# Patient Record
Sex: Female | Born: 1960
Health system: Southern US, Community
[De-identification: ages and names within clinical notes are randomized; demographics above are authoritative.]

## PROBLEM LIST (undated history)

## (undated) DIAGNOSIS — I10 Essential (primary) hypertension: Secondary | ICD-10-CM

## (undated) DIAGNOSIS — IMO0002 Reserved for concepts with insufficient information to code with codable children: Secondary | ICD-10-CM

## (undated) DIAGNOSIS — E785 Hyperlipidemia, unspecified: Secondary | ICD-10-CM

## (undated) DIAGNOSIS — E119 Type 2 diabetes mellitus without complications: Secondary | ICD-10-CM

## (undated) HISTORY — PX: HAMMER TOE SURGERY: SHX385

## (undated) HISTORY — PX: COLONOSCOPY: SHX174

## (undated) HISTORY — PX: TUBAL LIGATION: SHX77

## (undated) HISTORY — DX: Reserved for concepts with insufficient information to code with codable children: IMO0002

## (undated) HISTORY — DX: Hyperlipidemia, unspecified: E78.5

## (undated) HISTORY — DX: Type 2 diabetes mellitus without complications: E11.9

## (undated) HISTORY — DX: Essential (primary) hypertension: I10

## (undated) HISTORY — PX: ECTOPIC PREGNANCY SURGERY: SHX613

## (undated) HISTORY — PX: BREAST BIOPSY: SHX20

## (undated) HISTORY — PX: BREAST SURGERY: SHX581

---

## 1998-10-08 ENCOUNTER — Other Ambulatory Visit: Admission: RE | Admit: 1998-10-08 | Discharge: 1998-10-08 | Payer: Self-pay | Admitting: Obstetrics & Gynecology

## 1999-10-18 ENCOUNTER — Other Ambulatory Visit: Admission: RE | Admit: 1999-10-18 | Discharge: 1999-10-18 | Payer: Self-pay | Admitting: Obstetrics & Gynecology

## 2000-10-24 ENCOUNTER — Other Ambulatory Visit: Admission: RE | Admit: 2000-10-24 | Discharge: 2000-10-24 | Payer: Self-pay | Admitting: Obstetrics and Gynecology

## 2001-08-03 ENCOUNTER — Ambulatory Visit (HOSPITAL_COMMUNITY): Admission: RE | Admit: 2001-08-03 | Discharge: 2001-08-03 | Payer: Self-pay | Admitting: Gastroenterology

## 2001-08-09 ENCOUNTER — Encounter: Payer: Self-pay | Admitting: Gastroenterology

## 2001-08-09 ENCOUNTER — Ambulatory Visit (HOSPITAL_COMMUNITY): Admission: RE | Admit: 2001-08-09 | Discharge: 2001-08-09 | Payer: Self-pay | Admitting: Gastroenterology

## 2001-11-29 ENCOUNTER — Other Ambulatory Visit: Admission: RE | Admit: 2001-11-29 | Discharge: 2001-11-29 | Payer: Self-pay | Admitting: Obstetrics and Gynecology

## 2001-12-05 ENCOUNTER — Ambulatory Visit (HOSPITAL_COMMUNITY): Admission: RE | Admit: 2001-12-05 | Discharge: 2001-12-05 | Payer: Self-pay | Admitting: Obstetrics and Gynecology

## 2001-12-05 ENCOUNTER — Encounter: Payer: Self-pay | Admitting: Obstetrics and Gynecology

## 2002-02-23 ENCOUNTER — Emergency Department (HOSPITAL_COMMUNITY): Admission: EM | Admit: 2002-02-23 | Discharge: 2002-02-24 | Payer: Self-pay | Admitting: Emergency Medicine

## 2002-12-04 ENCOUNTER — Other Ambulatory Visit: Admission: RE | Admit: 2002-12-04 | Discharge: 2002-12-04 | Payer: Self-pay | Admitting: Obstetrics and Gynecology

## 2003-01-06 ENCOUNTER — Ambulatory Visit (HOSPITAL_COMMUNITY): Admission: RE | Admit: 2003-01-06 | Discharge: 2003-01-06 | Payer: Self-pay | Admitting: Obstetrics and Gynecology

## 2003-01-06 ENCOUNTER — Encounter: Payer: Self-pay | Admitting: Obstetrics and Gynecology

## 2003-12-14 ENCOUNTER — Emergency Department (HOSPITAL_COMMUNITY): Admission: EM | Admit: 2003-12-14 | Discharge: 2003-12-14 | Payer: Self-pay | Admitting: Emergency Medicine

## 2003-12-25 ENCOUNTER — Other Ambulatory Visit: Admission: RE | Admit: 2003-12-25 | Discharge: 2003-12-25 | Payer: Self-pay | Admitting: Obstetrics and Gynecology

## 2004-01-06 ENCOUNTER — Ambulatory Visit (HOSPITAL_COMMUNITY): Admission: RE | Admit: 2004-01-06 | Discharge: 2004-01-06 | Payer: Self-pay | Admitting: Obstetrics and Gynecology

## 2005-01-10 ENCOUNTER — Other Ambulatory Visit: Admission: RE | Admit: 2005-01-10 | Discharge: 2005-01-10 | Payer: Self-pay | Admitting: Obstetrics and Gynecology

## 2005-01-26 ENCOUNTER — Ambulatory Visit (HOSPITAL_COMMUNITY): Admission: RE | Admit: 2005-01-26 | Discharge: 2005-01-26 | Payer: Self-pay | Admitting: Obstetrics and Gynecology

## 2005-02-01 ENCOUNTER — Encounter: Admission: RE | Admit: 2005-02-01 | Discharge: 2005-02-01 | Payer: Self-pay | Admitting: Obstetrics and Gynecology

## 2005-07-27 ENCOUNTER — Emergency Department (HOSPITAL_COMMUNITY): Admission: EM | Admit: 2005-07-27 | Discharge: 2005-07-27 | Payer: Self-pay | Admitting: Emergency Medicine

## 2005-09-02 ENCOUNTER — Encounter: Admission: RE | Admit: 2005-09-02 | Discharge: 2005-09-02 | Payer: Self-pay | Admitting: Obstetrics and Gynecology

## 2006-03-07 ENCOUNTER — Other Ambulatory Visit: Admission: RE | Admit: 2006-03-07 | Discharge: 2006-03-07 | Payer: Self-pay | Admitting: Obstetrics and Gynecology

## 2006-04-03 ENCOUNTER — Encounter: Admission: RE | Admit: 2006-04-03 | Discharge: 2006-04-03 | Payer: Self-pay | Admitting: Obstetrics and Gynecology

## 2006-05-04 ENCOUNTER — Encounter: Admission: RE | Admit: 2006-05-04 | Discharge: 2006-05-04 | Payer: Self-pay | Admitting: Obstetrics and Gynecology

## 2007-06-19 ENCOUNTER — Encounter: Admission: RE | Admit: 2007-06-19 | Discharge: 2007-06-19 | Payer: Self-pay | Admitting: Obstetrics and Gynecology

## 2008-06-20 ENCOUNTER — Ambulatory Visit (HOSPITAL_COMMUNITY): Admission: RE | Admit: 2008-06-20 | Discharge: 2008-06-20 | Payer: Self-pay | Admitting: Obstetrics and Gynecology

## 2008-07-08 ENCOUNTER — Encounter: Admission: RE | Admit: 2008-07-08 | Discharge: 2008-07-08 | Payer: Self-pay | Admitting: Obstetrics and Gynecology

## 2008-07-10 ENCOUNTER — Encounter: Admission: RE | Admit: 2008-07-10 | Discharge: 2008-07-10 | Payer: Self-pay | Admitting: Obstetrics and Gynecology

## 2008-07-10 ENCOUNTER — Encounter (INDEPENDENT_AMBULATORY_CARE_PROVIDER_SITE_OTHER): Payer: Self-pay | Admitting: Diagnostic Radiology

## 2008-11-21 DIAGNOSIS — R87612 Low grade squamous intraepithelial lesion on cytologic smear of cervix (LGSIL): Secondary | ICD-10-CM

## 2008-11-21 DIAGNOSIS — IMO0002 Reserved for concepts with insufficient information to code with codable children: Secondary | ICD-10-CM

## 2008-11-21 HISTORY — DX: Reserved for concepts with insufficient information to code with codable children: IMO0002

## 2008-11-21 HISTORY — PX: COLPOSCOPY: SHX161

## 2008-11-21 HISTORY — DX: Low grade squamous intraepithelial lesion on cytologic smear of cervix (LGSIL): R87.612

## 2009-07-14 ENCOUNTER — Encounter: Admission: RE | Admit: 2009-07-14 | Discharge: 2009-07-14 | Payer: Self-pay | Admitting: Obstetrics and Gynecology

## 2009-09-21 ENCOUNTER — Encounter: Admission: RE | Admit: 2009-09-21 | Discharge: 2009-09-21 | Payer: Self-pay | Admitting: Orthopedic Surgery

## 2010-07-15 ENCOUNTER — Encounter: Admission: RE | Admit: 2010-07-15 | Discharge: 2010-07-15 | Payer: Self-pay | Admitting: Obstetrics and Gynecology

## 2010-12-12 ENCOUNTER — Encounter: Payer: Self-pay | Admitting: Obstetrics and Gynecology

## 2011-04-08 NOTE — Procedures (Signed)
[place]. [facility]  Patient:    Amber Barber, Amber Barber Visit Number: [id number] MRN: [id number]          Service Type: END Location: ENDO Attending Physician:  Charna Elizabeth Dictated by:   Anselmo Rod, M.D. Proc. Date: 08/03/01 Admit Date:  08/03/2001   CC:         Geraldo Pitter, M.D.   Procedure Report  DATE OF BIRTH: 1961/11/04  PROCEDURE: Esophagogastroduodenoscopy with biopsies.  ENDOSCOPIST: Anselmo Rod, M.D.  INSTRUMENT USED: Olympus video panendoscope.  INDICATIONS FOR PROCEDURE: This is a 50 year old African-American female, with a history of epigastric pain and reflux, rule out peptic ulcer disease.  PREPROCEDURE PREPARATION: Informed consent was procured from the patient and the patient fasted for eight hours prior to the procedure.  PREPROCEDURE PHYSICAL EXAMINATION:  VITAL SIGNS: Stable.  NECK: Supple.  CHEST: Clear to auscultation.  CARDIAC: S1 and S2, regular.  ABDOMEN: Soft, with normal bowel sounds.  DESCRIPTION OF PROCEDURE: The patient was placed in the left lateral decubitus position and sedated with 50 mg of Demerol and 5 mg of Versed intravenously. Once the patient was adequately sedated and maintained on low-flow oxygen and continuous cardiac monitoring the Olympus video panendoscope was advanced through the mouthpiece, over the tongue, and into the esophagus under direct vision.  The entire esophagus appeared normal and without lesions.  The scope was then advanced into the stomach and except for mild antral gastritis no other abnormalities were noted.  The proximal small bowel appeared normal as well.  IMPRESSION: Mild antral gastritis, otherwise normal esophagogastroduodenoscopy.  RECOMMENDATIONS: Proceed with colonoscopy at this time. Dictated by:   Anselmo Rod, M.D. Attending Physician:  Charna Elizabeth DD:  08/03/01 TD:  08/04/01 Job: [id number]

## 2011-04-08 NOTE — Procedures (Signed)
[place]. [facility]  Patient:    Amber Barber, Amber Barber Visit Number: [id number] MRN: [id number]          Service Type: END Location: ENDO Attending Physician:  Charna Elizabeth Dictated by:   Anselmo Rod, M.D. Proc. Date: 08/03/01 Admit Date:  08/03/2001   CC:         Geraldo Pitter, M.D.   Procedure Report  DATE OF BIRTH:  [birth year]  PROCEDURE PERFORMED:  Colonoscopy up to the mid-right colon.  ENDOSCOPIST:  Anselmo Rod, M.D.  INSTRUMENT USED:  Olympus video colonoscope.  INDICATION FOR PROCEDURE:  Family history of colon cancer; the patients sisters had colon cancer diagnosed at 78.  Rule out colonic polyps, masses, hemorrhoids, etc.  PREPROCEDURE PREPARATION:  Informed consent was procured from the patient. The patient was fasted for eight hours prior to the procedure and prepped with a bottle of magnesium citrate and a gallon of NuLytely the night prior to the procedure.  PREPROCEDURE PHYSICAL:  VITAL SIGNS:  Patient had stable vital signs.  NECK:  Supple.  CHEST:  Clear to auscultation.  S1 and S2 regular.  ABDOMEN:  Soft with normal bowel sounds.  DESCRIPTION OF THE PROCEDURE:  The patient was placed in the left lateral decubitus position.  Patient was sedated with Demerol and Versed for the EGD. No additional sedation was used for the colonoscopy.  Once the patient was adequately positioned and maintained on low-flow oxygen and continuous cardiac monitoring, the Olympus video colonoscope was advanced from the rectum to the mid-right colon with extreme difficulty secondary to a large amount of residual stool in the colon.  Multiple washings were done.  The patients position was changed from the left lateral to the supine and then to the right lateral position to adequately visualize the colonic mucosa but this was not possible.  The procedure was aborted in the mid-right colon.  No abnormalities were noted, however, small  lesions could have been missed.  IMPRESSION:  Incomplete prep, procedure aborted at mid-right colon.  RECOMMENDATIONS:  An air contrast barium enema is planned for the patient. Further recommendations will be made thereafter. Dictated by:   Anselmo Rod, M.D. Attending Physician:  Charna Elizabeth DD:  08/03/01 TD:  08/04/01 Job: [id number]

## 2011-08-29 ENCOUNTER — Other Ambulatory Visit: Payer: Self-pay | Admitting: Obstetrics and Gynecology

## 2011-08-29 DIAGNOSIS — Z1231 Encounter for screening mammogram for malignant neoplasm of breast: Secondary | ICD-10-CM

## 2011-09-08 ENCOUNTER — Ambulatory Visit: Payer: Self-pay

## 2011-09-16 ENCOUNTER — Ambulatory Visit
Admission: RE | Admit: 2011-09-16 | Discharge: 2011-09-16 | Disposition: A | Discharge status: Home / Self Care | Payer: BC Managed Care – PPO | Source: Ambulatory Visit | Attending: Obstetrics and Gynecology | Admitting: Obstetrics and Gynecology

## 2011-09-16 DIAGNOSIS — Z1231 Encounter for screening mammogram for malignant neoplasm of breast: Secondary | ICD-10-CM

## 2011-09-21 ENCOUNTER — Other Ambulatory Visit: Payer: Self-pay | Admitting: Obstetrics and Gynecology

## 2011-09-21 DIAGNOSIS — R928 Other abnormal and inconclusive findings on diagnostic imaging of breast: Secondary | ICD-10-CM

## 2011-10-04 ENCOUNTER — Ambulatory Visit
Admission: RE | Admit: 2011-10-04 | Discharge: 2011-10-04 | Disposition: A | Discharge status: Home / Self Care | Payer: BC Managed Care – PPO | Source: Ambulatory Visit | Attending: Obstetrics and Gynecology | Admitting: Obstetrics and Gynecology

## 2011-10-04 ENCOUNTER — Other Ambulatory Visit: Payer: Self-pay | Admitting: Obstetrics and Gynecology

## 2011-10-04 DIAGNOSIS — R928 Other abnormal and inconclusive findings on diagnostic imaging of breast: Secondary | ICD-10-CM

## 2011-10-17 ENCOUNTER — Other Ambulatory Visit: Payer: Self-pay | Admitting: Obstetrics and Gynecology

## 2011-10-17 ENCOUNTER — Ambulatory Visit
Admission: RE | Admit: 2011-10-17 | Discharge: 2011-10-17 | Disposition: A | Discharge status: Home / Self Care | Payer: BC Managed Care – PPO | Source: Ambulatory Visit | Attending: Obstetrics and Gynecology | Admitting: Obstetrics and Gynecology

## 2011-10-17 DIAGNOSIS — R928 Other abnormal and inconclusive findings on diagnostic imaging of breast: Secondary | ICD-10-CM

## 2012-09-17 ENCOUNTER — Other Ambulatory Visit: Payer: Self-pay | Admitting: Obstetrics and Gynecology

## 2012-09-17 DIAGNOSIS — Z1231 Encounter for screening mammogram for malignant neoplasm of breast: Secondary | ICD-10-CM

## 2012-10-02 ENCOUNTER — Encounter: Payer: Self-pay | Admitting: Obstetrics and Gynecology

## 2012-10-02 ENCOUNTER — Ambulatory Visit (INDEPENDENT_AMBULATORY_CARE_PROVIDER_SITE_OTHER): Payer: BC Managed Care – PPO | Admitting: Obstetrics and Gynecology

## 2012-10-02 VITALS — BP 112/70 | HR 72 | Ht 69.0 in | Wt 185.0 lb

## 2012-10-02 DIAGNOSIS — Z124 Encounter for screening for malignant neoplasm of cervix: Secondary | ICD-10-CM

## 2012-10-02 DIAGNOSIS — Z8349 Family history of other endocrine, nutritional and metabolic diseases: Secondary | ICD-10-CM

## 2012-10-02 DIAGNOSIS — N951 Menopausal and female climacteric states: Secondary | ICD-10-CM

## 2012-10-02 DIAGNOSIS — N390 Urinary tract infection, site not specified: Secondary | ICD-10-CM

## 2012-10-02 DIAGNOSIS — Z01419 Encounter for gynecological examination (general) (routine) without abnormal findings: Secondary | ICD-10-CM

## 2012-10-02 DIAGNOSIS — N898 Other specified noninflammatory disorders of vagina: Secondary | ICD-10-CM

## 2012-10-02 LAB — POCT URINALYSIS DIPSTICK
Bilirubin, UA: NEGATIVE
Ketones, UA: NEGATIVE
Nitrite, UA: NEGATIVE
Protein, UA: NEGATIVE

## 2012-10-02 LAB — TSH: TSH: 1.487 u[IU]/mL (ref 0.350–4.500)

## 2012-10-02 MED ORDER — CIPROFLOXACIN HCL 500 MG PO TABS: 500.0000 mg | ORAL_TABLET | Freq: Two times a day (BID) | ORAL | Status: AC

## 2012-10-02 MED ORDER — PAROXETINE MESYLATE 7.5 MG PO CAPS: 1.0000 | ORAL_CAPSULE | Freq: Every day | ORAL | Status: DC

## 2012-10-02 MED ORDER — FLUCONAZOLE 150 MG PO TABS: 150.0000 mg | ORAL_TABLET | Freq: Once | ORAL | Status: DC

## 2012-10-02 NOTE — Progress Notes (Signed)
Regular Periods: no Mammogram: yes  Monthly Breast Ex.: yes Exercise: yes  Tetanus < 10 years: no Seatbelts: yes  NI. Bladder Functn.: yes Abuse at home: no  Daily BM's: yes Stressful Work: no  Healthy Diet: yes Sigmoid-Colonoscopy: about 10 years ago  Calcium: no Medical problems this year: check for yeast and uti   LAST PAP:7/12  Contraception: none  Mammogram:  2012  PCP: DR. Parke Simmers  PMH: NO CHANGE  FMH: NO CHANGE  Last Bone Scan: NO  PT IS MARRIED

## 2012-10-02 NOTE — Progress Notes (Signed)
Subjective:    Amber Barber is a 51 y.o. female, G3P0, who presents for an annual exam. The patient reports yeast vs uti symptoms and would like evaluation.  Menstrual cycle:   LMP: Patient's last menstrual period was 12/03/2011.             Review of Systems Pertinent items are noted in HPI. Denies pelvic pain, urinary tract symptoms, vaginitis symptoms, irregular bleeding, menopausal symptoms, change in bowel habits or rectal bleeding   Objective:    Ht 5\' 9"  (1.753 m)  Wt 185 lb (83.915 kg)  BMI 27.32 kg/m2  LMP 12/03/2011    Wt Readings from Last 1 Encounters:  10/02/12 185 lb (83.915 kg)   Body mass index is 27.32 kg/(m^2). General Appearance: Alert, no acute distress HEENT: Grossly normal Neck / Thyroid: Supple, no thyromegaly or cervical adenopathy Lungs: Clear to auscultation bilaterally Back: No CVA tenderness Breast Exam: No masses or nodes.No dimpling, nipple retraction or discharge. Cardiovascular: Regular rate and rhythm.  Gastrointestinal: Soft, non-tender, no masses or organomegaly Pelvic Exam: EGBUS-wnl, vagina-normal rugae, cervix- without lesions or tenderness, uterus appears normal size shape and consistency, adnexae-no masses or tenderness Rectovaginal: no masses and normal sphincter tone Lymphatic Exam: Non-palpable nodes in neck, clavicular,  axillary, or inguinal regions  Skin: no rashes or abnormalities Extremities: no clubbing cyanosis or edema  Neurologic: grossly normal Psychiatric: Alert and oriented  Wet Prep: pH-4.5, whiff-negative,  no clue cells, yeast or trich Urinalysis: SG-1.020,  pH-6,  leuk-4+   Assessment:   Routine GYN Exam UTI  Vasomotor symptoms Family History-Thyroid Disease Plan:  urine for culture Cipro 500 mg # 6 1 po bid x 3 days  Diflucan 150 mg  #1  1 po stat 1 refill prn  Reviewed management options for menopausal symptoms: herbal, hormonal and misc. Gave brochure on Estrovera,  ACOG-Herbal Remedies for Menopause     Patient considering Leanora Ivanoff and Brisdelle (Paroxetine 7.5 mg) for vasomotor symptoms Brisdelle sample x 2 given  1 po qd  PAP sent  RTO 1 year or prn  Liba Hulsey,ELMIRAPA-C

## 2012-10-03 LAB — PAP IG W/ RFLX HPV ASCU

## 2012-10-06 LAB — URINE CULTURE

## 2012-10-24 ENCOUNTER — Ambulatory Visit
Admission: RE | Admit: 2012-10-24 | Discharge: 2012-10-24 | Disposition: A | Discharge status: Home / Self Care | Payer: BC Managed Care – PPO | Source: Ambulatory Visit | Attending: Obstetrics and Gynecology | Admitting: Obstetrics and Gynecology

## 2012-10-24 DIAGNOSIS — Z1231 Encounter for screening mammogram for malignant neoplasm of breast: Secondary | ICD-10-CM

## 2012-10-29 ENCOUNTER — Encounter: Payer: Self-pay | Admitting: Obstetrics and Gynecology

## 2013-01-08 ENCOUNTER — Telehealth: Payer: Self-pay | Admitting: Obstetrics and Gynecology

## 2013-01-08 MED ORDER — FLUCONAZOLE 150 MG PO TABS
150.0000 mg | ORAL_TABLET | Freq: Once | ORAL | Status: DC
Start: 2013-01-08 — End: 2013-09-09

## 2013-01-08 NOTE — Telephone Encounter (Signed)
Pt only wants to speak with Amber Barber

## 2013-01-08 NOTE — Telephone Encounter (Signed)
Tc to pt regarding message. Pt is having discharge and would like rx for vaginal discharge. Informed pt that I can call in Diflucan and if this do not take care of the problem;pt will need an appt. Pt voiced understanding.

## 2013-01-08 NOTE — Telephone Encounter (Signed)
Lm on vm tcb rgd msg 

## 2013-02-13 ENCOUNTER — Other Ambulatory Visit: Payer: Self-pay | Admitting: Family Medicine

## 2013-02-13 ENCOUNTER — Ambulatory Visit
Admission: RE | Admit: 2013-02-13 | Discharge: 2013-02-13 | Disposition: A | Discharge status: Home / Self Care | Payer: BC Managed Care – PPO | Source: Ambulatory Visit | Attending: Family Medicine | Admitting: Family Medicine

## 2013-02-13 DIAGNOSIS — M25519 Pain in unspecified shoulder: Secondary | ICD-10-CM

## 2013-06-28 ENCOUNTER — Other Ambulatory Visit: Payer: Self-pay | Admitting: Orthopedic Surgery

## 2013-06-28 DIAGNOSIS — M25511 Pain in right shoulder: Secondary | ICD-10-CM

## 2013-07-04 ENCOUNTER — Ambulatory Visit
Admission: RE | Admit: 2013-07-04 | Discharge: 2013-07-04 | Disposition: A | Discharge status: Home / Self Care | Payer: BC Managed Care – PPO | Source: Ambulatory Visit | Attending: Orthopedic Surgery | Admitting: Orthopedic Surgery

## 2013-07-04 DIAGNOSIS — M25511 Pain in right shoulder: Secondary | ICD-10-CM

## 2013-07-06 ENCOUNTER — Encounter (HOSPITAL_COMMUNITY): Payer: Self-pay | Admitting: Emergency Medicine

## 2013-07-06 ENCOUNTER — Emergency Department (INDEPENDENT_AMBULATORY_CARE_PROVIDER_SITE_OTHER)
Admission: EM | Admit: 2013-07-06 | Discharge: 2013-07-06 | Disposition: A | Discharge status: Home / Self Care | Payer: BC Managed Care – PPO | Source: Home / Self Care | Attending: Emergency Medicine | Admitting: Emergency Medicine

## 2013-07-06 DIAGNOSIS — T07XXXA Unspecified multiple injuries, initial encounter: Secondary | ICD-10-CM

## 2013-07-06 DIAGNOSIS — IMO0002 Reserved for concepts with insufficient information to code with codable children: Secondary | ICD-10-CM

## 2013-07-06 MED ORDER — MELOXICAM 7.5 MG PO TABS: 7.5000 mg | ORAL_TABLET | Freq: Every day | ORAL | Status: AC

## 2013-07-06 MED ORDER — CYCLOBENZAPRINE HCL 10 MG PO TABS: 10.0000 mg | ORAL_TABLET | Freq: Two times a day (BID) | ORAL | Status: DC | PRN
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
## 2013-09-04 ENCOUNTER — Other Ambulatory Visit: Payer: Self-pay | Admitting: Orthopedic Surgery

## 2013-09-11 ENCOUNTER — Encounter (HOSPITAL_COMMUNITY)
Admission: RE | Admit: 2013-09-11 | Discharge: 2013-09-11 | Disposition: A | Discharge status: Home / Self Care | Payer: BC Managed Care – PPO | Source: Ambulatory Visit | Attending: Orthopedic Surgery | Admitting: Orthopedic Surgery

## 2013-09-11 ENCOUNTER — Encounter (HOSPITAL_COMMUNITY): Payer: Self-pay

## 2013-09-11 ENCOUNTER — Ambulatory Visit (HOSPITAL_COMMUNITY)
Admission: RE | Admit: 2013-09-11 | Discharge: 2013-09-11 | Disposition: A | Discharge status: Home / Self Care | Payer: BC Managed Care – PPO | Source: Ambulatory Visit | Attending: Orthopedic Surgery | Admitting: Orthopedic Surgery

## 2013-09-11 DIAGNOSIS — Z01812 Encounter for preprocedural laboratory examination: Secondary | ICD-10-CM | POA: Insufficient documentation

## 2013-09-11 DIAGNOSIS — Z01810 Encounter for preprocedural cardiovascular examination: Secondary | ICD-10-CM | POA: Insufficient documentation

## 2013-09-11 DIAGNOSIS — Z01818 Encounter for other preprocedural examination: Secondary | ICD-10-CM | POA: Insufficient documentation

## 2013-09-11 LAB — URINALYSIS, ROUTINE W REFLEX MICROSCOPIC
Bilirubin Urine: NEGATIVE
Glucose, UA: NEGATIVE mg/dL
Hgb urine dipstick: NEGATIVE
Nitrite: NEGATIVE
Specific Gravity, Urine: 1.024 (ref 1.005–1.030)
pH: 5.5 (ref 5.0–8.0)

## 2013-09-11 LAB — CBC
MCH: 26.7 pg (ref 26.0–34.0)
MCHC: 32 g/dL (ref 30.0–36.0)
Platelets: 170 10*3/uL (ref 150–400)

## 2013-09-11 LAB — COMPREHENSIVE METABOLIC PANEL
ALT: 37 U/L — ABNORMAL HIGH (ref 0–35)
AST: 31 U/L (ref 0–37)
Albumin: 4.4 g/dL (ref 3.5–5.2)
Calcium: 10.1 mg/dL (ref 8.4–10.5)
Potassium: 3.8 mEq/L (ref 3.5–5.1)
Sodium: 137 mEq/L (ref 135–145)
Total Protein: 8.1 g/dL (ref 6.0–8.3)

## 2013-09-11 NOTE — Pre-Procedure Instructions (Signed)
Amber Barber  09/11/2013   Your procedure is scheduled on:  09/19/2013  Report to [facility]  2 * 3  Entrance A at  8:30 AM.  Call this number if you have problems the morning of surgery: [phone]   Remember:   Do not eat food or drink liquids after midnight. On Wednesday   Take these medicines the morning of surgery with A SIP OF WATER: NONE   Do not wear jewelry, make-up or nail polish.  Do not wear lotions, powders, or perfumes. You may wear deodorant.  Do not shave 48 hours prior to surgery.   Do not bring valuables to the hospital.  [facility] is not responsible                  for any belongings or valuables.               Contacts, dentures or bridgework may not be worn into surgery.  Leave suitcase in the car. After surgery it may be brought to your room.  For patients admitted to the hospital, discharge time is determined by your                treatment team.               Patients discharged the day of surgery will not be allowed to drive  home.  Name and phone number of your driver: with [employer]  Special Instructions: Shower using CHG 2 nights before surgery and the night before surgery.  If you shower the day of surgery use CHG.  Use special wash - you have one bottle of CHG for all showers.  You should use approximately 1/3 of the bottle for each shower.   Please read over the following fact sheets that you were given: Pain Booklet, Coughing and Deep Breathing and Surgical Site Infection Prevention

## 2013-09-11 NOTE — Progress Notes (Signed)
Pt. Doesn't have any recall of ever having an ekg in the past. Pt. Denies any event with chest pain, SOB, difficulty breathing.

## 2013-09-18 MED ORDER — VANCOMYCIN HCL IN DEXTROSE 1-5 GM/200ML-% IV SOLN
1000.0000 mg | INTRAVENOUS | Status: AC
  Administered 2013-09-19: 1000 mg via INTRAVENOUS
  Filled 2013-09-18: qty 200

## 2013-09-18 NOTE — Progress Notes (Signed)
I spoke with Amber Barber visa cell phone; pt made aware to arrive at 6:00AM, tomorrow- 09/19/2013.

## 2013-09-19 ENCOUNTER — Ambulatory Visit (HOSPITAL_COMMUNITY): Payer: BC Managed Care – PPO | Admitting: Anesthesiology

## 2013-09-19 ENCOUNTER — Encounter (HOSPITAL_COMMUNITY): Payer: BC Managed Care – PPO | Admitting: Anesthesiology

## 2013-09-19 ENCOUNTER — Encounter (HOSPITAL_COMMUNITY): Payer: Self-pay | Admitting: Anesthesiology

## 2013-09-19 ENCOUNTER — Encounter (HOSPITAL_COMMUNITY)
Admission: RE | Disposition: A | Discharge status: Home / Self Care | Payer: Self-pay | Source: Ambulatory Visit | Attending: Orthopedic Surgery

## 2013-09-19 ENCOUNTER — Ambulatory Visit (HOSPITAL_COMMUNITY)
Admission: RE | Admit: 2013-09-19 | Discharge: 2013-09-19 | Disposition: A | Discharge status: Home / Self Care | Payer: BC Managed Care – PPO | Source: Ambulatory Visit | Attending: Orthopedic Surgery | Admitting: Orthopedic Surgery

## 2013-09-19 DIAGNOSIS — M25819 Other specified joint disorders, unspecified shoulder: Secondary | ICD-10-CM | POA: Insufficient documentation

## 2013-09-19 DIAGNOSIS — M719 Bursopathy, unspecified: Secondary | ICD-10-CM | POA: Insufficient documentation

## 2013-09-19 DIAGNOSIS — M9420 Chondromalacia, unspecified site: Secondary | ICD-10-CM | POA: Insufficient documentation

## 2013-09-19 DIAGNOSIS — M67919 Unspecified disorder of synovium and tendon, unspecified shoulder: Secondary | ICD-10-CM | POA: Insufficient documentation

## 2013-09-19 DIAGNOSIS — G8918 Other acute postprocedural pain: Secondary | ICD-10-CM

## 2013-09-19 HISTORY — PX: SHOULDER ARTHROSCOPY: SHX128

## 2013-09-19 SURGERY — ARTHROSCOPY, SHOULDER
Anesthesia: General | Site: Shoulder | Laterality: Right | Wound class: Clean

## 2013-09-19 SURGERY — ARTHROSCOPY, SHOULDER
Anesthesia: General | Laterality: Right

## 2013-09-19 MED ORDER — PROPOFOL 10 MG/ML IV BOLUS
INTRAVENOUS | Status: DC | PRN
  Administered 2013-09-19: 175 mg via INTRAVENOUS

## 2013-09-19 MED ORDER — OXYCODONE-ACETAMINOPHEN 7.5-325 MG PO TABS: 1.0000 | ORAL_TABLET | ORAL | Status: DC | PRN

## 2013-09-19 MED ORDER — PROMETHAZINE HCL 25 MG/ML IJ SOLN: 6.2500 mg | INTRAMUSCULAR | Status: DC | PRN

## 2013-09-19 MED ORDER — OXYCODONE HCL 5 MG/5ML PO SOLN: 5.0000 mg | Freq: Once | ORAL | Status: DC | PRN

## 2013-09-19 MED ORDER — LACTATED RINGERS IV SOLN
INTRAVENOUS | Status: DC | PRN
  Administered 2013-09-19: 07:00:00 via INTRAVENOUS

## 2013-09-19 MED ORDER — SODIUM CHLORIDE 0.9 % IV SOLN
10.0000 mg | INTRAVENOUS | Status: DC | PRN
  Administered 2013-09-19: 20 ug/min via INTRAVENOUS

## 2013-09-19 MED ORDER — GLYCOPYRROLATE 0.2 MG/ML IJ SOLN
INTRAMUSCULAR | Status: DC | PRN
  Administered 2013-09-19: .8 mg via INTRAVENOUS

## 2013-09-19 MED ORDER — DEXAMETHASONE SODIUM PHOSPHATE 4 MG/ML IJ SOLN
INTRAMUSCULAR | Status: DC | PRN
  Administered 2013-09-19: 4 mg

## 2013-09-19 MED ORDER — BUPIVACAINE-EPINEPHRINE PF 0.5-1:200000 % IJ SOLN
INTRAMUSCULAR | Status: DC | PRN
  Administered 2013-09-19: 25 mL

## 2013-09-19 MED ORDER — MIDAZOLAM HCL 2 MG/2ML IJ SOLN: 1.0000 mg | INTRAMUSCULAR | Status: DC | PRN

## 2013-09-19 MED ORDER — HYDROMORPHONE HCL PF 1 MG/ML IJ SOLN: 0.2500 mg | INTRAMUSCULAR | Status: DC | PRN

## 2013-09-19 MED ORDER — CHLORHEXIDINE GLUCONATE 4 % EX LIQD: 60.0000 mL | Freq: Once | CUTANEOUS | Status: DC

## 2013-09-19 MED ORDER — FENTANYL CITRATE 0.05 MG/ML IJ SOLN
INTRAMUSCULAR | Status: DC | PRN
  Administered 2013-09-19: 50 ug via INTRAVENOUS

## 2013-09-19 MED ORDER — EPINEPHRINE HCL 1 MG/ML IJ SOLN
INTRAMUSCULAR | Status: AC
  Filled 2013-09-19: qty 2

## 2013-09-19 MED ORDER — NEOSTIGMINE METHYLSULFATE 1 MG/ML IJ SOLN
INTRAMUSCULAR | Status: DC | PRN
  Administered 2013-09-19: 4 mg via INTRAVENOUS

## 2013-09-19 MED ORDER — FENTANYL CITRATE 0.05 MG/ML IJ SOLN: 50.0000 ug | Freq: Once | INTRAMUSCULAR | Status: DC

## 2013-09-19 MED ORDER — SODIUM CHLORIDE 0.9 % IR SOLN
Status: DC | PRN
  Administered 2013-09-19: 6000 mL

## 2013-09-19 MED ORDER — MIDAZOLAM HCL 5 MG/5ML IJ SOLN
INTRAMUSCULAR | Status: DC | PRN
  Administered 2013-09-19: 2 mg via INTRAVENOUS

## 2013-09-19 MED ORDER — EPINEPHRINE HCL 1 MG/ML IJ SOLN
INTRAMUSCULAR | Status: DC | PRN
  Administered 2013-09-19: 2 mg

## 2013-09-19 MED ORDER — OXYCODONE HCL 5 MG PO TABS: 5.0000 mg | ORAL_TABLET | Freq: Once | ORAL | Status: DC | PRN

## 2013-09-19 MED ORDER — ONDANSETRON HCL 4 MG/2ML IJ SOLN
INTRAMUSCULAR | Status: DC | PRN
  Administered 2013-09-19: 4 mg via INTRAVENOUS

## 2013-09-19 MED ORDER — MIDAZOLAM HCL 2 MG/2ML IJ SOLN
INTRAMUSCULAR | Status: AC
  Filled 2013-09-19: qty 2

## 2013-09-19 MED ORDER — ROCURONIUM BROMIDE 100 MG/10ML IV SOLN
INTRAVENOUS | Status: DC | PRN
  Administered 2013-09-19: 50 mg via INTRAVENOUS

## 2013-09-19 MED ORDER — ARTIFICIAL TEARS OP OINT
TOPICAL_OINTMENT | OPHTHALMIC | Status: DC | PRN
  Administered 2013-09-19: 1 via OPHTHALMIC

## 2013-09-19 MED ORDER — LIDOCAINE HCL (CARDIAC) 20 MG/ML IV SOLN
INTRAVENOUS | Status: DC | PRN
  Administered 2013-09-19: 50 mg via INTRAVENOUS

## 2013-09-19 SURGICAL SUPPLY — 39 items
BLADE CUTTER GATOR 3.5 (BLADE) IMPLANT
BLADE GREAT WHITE 4.2 (BLADE) ×2 IMPLANT
BLADE SURG 11 STRL SS (BLADE) ×2 IMPLANT
BUR OVAL 4.0 (BURR) ×2 IMPLANT
CLOTH BEACON ORANGE TIMEOUT ST (SAFETY) IMPLANT
COVER SURGICAL LIGHT HANDLE (MISCELLANEOUS) ×2 IMPLANT
DRAPE STERI 35X30 U-POUCH (DRAPES) ×2 IMPLANT
DRAPE U-SHAPE 47X51 STRL (DRAPES) ×2 IMPLANT
DRSG EMULSION OIL 3X3 NADH (GAUZE/BANDAGES/DRESSINGS) ×1 IMPLANT
DRSG PAD ABDOMINAL 8X10 ST (GAUZE/BANDAGES/DRESSINGS) ×2 IMPLANT
DURAPREP 26ML APPLICATOR (WOUND CARE) ×2 IMPLANT
ELECT MENISCUS 165MM 90D (ELECTRODE) IMPLANT
FILTER STRAW FLUID ASPIR (MISCELLANEOUS) ×2 IMPLANT
GLOVE SS PI 9.0 STRL (GLOVE) ×2 IMPLANT
GOWN PREVENTION PLUS XLARGE (GOWN DISPOSABLE) ×2 IMPLANT
GOWN STRL NON-REIN LRG LVL3 (GOWN DISPOSABLE) ×4 IMPLANT
KIT BASIN OR (CUSTOM PROCEDURE TRAY) ×2 IMPLANT
KIT ROOM TURNOVER OR (KITS) ×2 IMPLANT
MANIFOLD NEPTUNE II (INSTRUMENTS) ×2 IMPLANT
NDL HYPO 21X1.5 SAFETY (NEEDLE) ×1 IMPLANT
NEEDLE HYPO 21X1.5 SAFETY (NEEDLE) IMPLANT
NEEDLE HYPO 25GX1X1/2 BEV (NEEDLE) ×2 IMPLANT
NS IRRIG 1000ML POUR BTL (IV SOLUTION) ×2 IMPLANT
PACK SHOULDER (CUSTOM PROCEDURE TRAY) ×2 IMPLANT
PAD ARMBOARD 7.5X6 YLW CONV (MISCELLANEOUS) ×4 IMPLANT
SET ARTHROSCOPY TUBING (MISCELLANEOUS) ×2
SET ARTHROSCOPY TUBING LN (MISCELLANEOUS) ×1 IMPLANT
SPONGE GAUZE 4X4 12PLY (GAUZE/BANDAGES/DRESSINGS) ×2 IMPLANT
SPONGE LAP 4X18 X RAY DECT (DISPOSABLE) IMPLANT
SUT ETHIBOND 2 OS 4 DA (SUTURE) IMPLANT
SUT ETHILON 4 0 PS 2 18 (SUTURE) ×2 IMPLANT
SUT PROLENE 3 0 PS 2 (SUTURE) IMPLANT
SUT VIC AB 0 CTB1 27 (SUTURE) IMPLANT
SUT VIC AB 2-0 FS1 27 (SUTURE) IMPLANT
SYR 3ML LL SCALE MARK (SYRINGE) ×2 IMPLANT
SYR CONTROL 10ML LL (SYRINGE) ×2 IMPLANT
TOWEL OR 17X24 6PK STRL BLUE (TOWEL DISPOSABLE) ×2 IMPLANT
TOWEL OR 17X26 10 PK STRL BLUE (TOWEL DISPOSABLE) ×2 IMPLANT
WATER STERILE IRR 1000ML POUR (IV SOLUTION) ×2 IMPLANT

## 2013-09-19 NOTE — Anesthesia Procedure Notes (Addendum)
Anesthesia Regional Block:  Interscalene brachial plexus block  Pre-Anesthetic Checklist: ,, timeout performed, Correct Patient, Correct Site, Correct Laterality, Correct Procedure, Correct Position, site marked, Risks and benefits discussed,  Surgical consent,  Pre-op evaluation,  At surgeon's request and post-op pain management  Laterality: Right  Prep: chloraprep       Needles:  Injection technique: Single-shot  Needle Type: Echogenic Stimulator Needle     Needle Length: 5cm 5 cm Needle Gauge: 22 and 22 G    Additional Needles:  Procedures: ultrasound guided (picture in chart) and nerve stimulator Interscalene brachial plexus block  Nerve Stimulator or Paresthesia:  Response: 0.5 mA,   Additional Responses:   Narrative:  Start time: 09/19/2013 7:04 AM End time: 09/19/2013 7:15 AM Injection made incrementally with aspirations every 5 mL. Anesthesiologist: Dr Gypsy Balsam  Additional Notes: [id number] R ISB POP CHG prep, sterile tech #22 stim/echo needle with stim down to .5ma and good [place] stimulation-PIX in chart Multiple neg asp Vernia Buff .5% w/epi 1:200000 total 25cc+decadron 4mg  infiltrated No compl Dr Gypsy Balsam     Procedure Name: Intubation Date/Time: 09/19/2013 8:02 AM Performed by: Carmela Rima Pre-anesthesia Checklist: Patient identified, Timeout performed, Emergency Drugs available, Suction available and Patient being monitored Patient Re-evaluated:Patient Re-evaluated prior to inductionOxygen Delivery Method: Circle system utilized Preoxygenation: Pre-oxygenation with 100% oxygen Intubation Type: IV induction Ventilation: Mask ventilation without difficulty Laryngoscope Size: Mac and 3 Grade View: Grade I Tube type: Oral Tube size: 7.5 mm Number of attempts: 1 Placement Confirmation: ETT inserted through vocal cords under direct vision,  positive ETCO2 and breath sounds checked- equal and bilateral Secured at: 22 cm Tube secured with: Tape Dental  Injury: Teeth and Oropharynx as per pre-operative assessment

## 2013-09-19 NOTE — Transfer of Care (Signed)
Immediate Anesthesia Transfer of Care Note  Patient: Amber Barber  Procedure(s) Performed: Procedure(s): RIGHT ARTHROSCOPY SHOULDER WITH ACROMIOPLASTY  (Right)  Patient Location: PACU  Anesthesia Type:General  Level of Consciousness: oriented and sedated  Airway & Oxygen Therapy: Patient Spontanous Breathing and Patient connected to nasal cannula oxygen  Post-op Assessment: Report given to PACU RN, Post -op Vital signs reviewed and stable and Patient moving all extremities X 4  Post vital signs: Reviewed and stable  Complications: No apparent anesthesia complications

## 2013-09-19 NOTE — Anesthesia Preprocedure Evaluation (Addendum)
Anesthesia Evaluation  Patient identified by MRN, date of birth, ID band Patient awake    Reviewed: Allergy & Precautions, H&P , NPO status , Patient's Chart, lab work & pertinent test results  Airway Mallampati: I TM Distance: >3 FB Neck ROM: Full    Dental  (+) Dental Advidsory Given and Teeth Intact   Pulmonary  breath sounds clear to auscultation        Cardiovascular Rhythm:Regular Rate:Normal     Neuro/Psych    GI/Hepatic   Endo/Other    Renal/GU      Musculoskeletal   Abdominal   Peds  Hematology   Anesthesia Other Findings   Reproductive/Obstetrics                          Anesthesia Physical Anesthesia Plan  ASA: I  Anesthesia Plan: General   Post-op Pain Management:    Induction: Intravenous  Airway Management Planned: Oral ETT  Additional Equipment:   Intra-op Plan:   Post-operative Plan: Extubation in OR  Informed Consent: I have reviewed the patients History and Physical, chart, labs and discussed the procedure including the risks, benefits and alternatives for the proposed anesthesia with the patient or authorized representative who has indicated his/her understanding and acceptance.   Dental Advisory Given  Plan Discussed with: CRNA, Surgeon and Anesthesiologist  Anesthesia Plan Comments:        Anesthesia Quick Evaluation

## 2013-09-19 NOTE — Anesthesia Postprocedure Evaluation (Signed)
  Anesthesia Post-op Note  Patient: Amber Barber  Procedure(s) Performed: Procedure(s): RIGHT ARTHROSCOPY SHOULDER WITH ACROMIOPLASTY  (Right)  Patient Location: PACU  Anesthesia Type:General and GA combined with regional for post-op pain  Level of Consciousness: awake  Airway and Oxygen Therapy: Patient Spontanous Breathing  Post-op Pain: none  Post-op Assessment: Post-op Vital signs reviewed, Patient's Cardiovascular Status Stable, Respiratory Function Stable, Patent Airway, No signs of Nausea or vomiting and Pain level controlled  Post-op Vital Signs: Reviewed and stable  Complications: No apparent anesthesia complications

## 2013-09-19 NOTE — Preoperative (Signed)
Beta Blockers   Reason not to administer Beta Blockers:Not Applicable 

## 2013-09-19 NOTE — Brief Op Note (Signed)
09/19/2013  9:14 AM  PATIENT:  Amber Barber  52 y.o. female  PRE-OPERATIVE DIAGNOSIS:  IMPINGEMENT SYNDROME RIGHT SHOULDER  POST-OPERATIVE DIAGNOSIS:  impingement syndrome right shoulder  PROCEDURE:  Procedure(s): RIGHT ARTHROSCOPY SHOULDER WITH ACROMIOPLASTY  (Right)  SURGEON:  Surgeon(s) and Role:    * Kennieth Rad, MD - Primary  PHYSICIAN ASSISTANT:   ASSISTANTS: none   ANESTHESIA:   general  EBL:     BLOOD ADMINISTERED:none  DRAINS: none   LOCAL MEDICATIONS USED:  NONE  SPECIMEN:  No Specimen  DISPOSITION OF SPECIMEN:  N/A  COUNTS:  YES  TOURNIQUET:  * No tourniquets in log *  DICTATION: .Other Dictation: Dictation Number  REPORT #[id number]  PLAN OF CARE: Discharge to home after PACU  PATIENT DISPOSITION:  PACU - hemodynamically stable.   Delay start of Pharmacological VTE agent (>24hrs) due to surgical blood loss or risk of bleeding: not applicable

## 2013-09-19 NOTE — H&P (Signed)
Amber Barber is an 52 y.o. female.   Chief Complaint: PAINFUL RIGHT SHOULDER HPI: THIS IS A 52 Y/O FEMALE TREATED FOR IMPINGEMENT SYNDROME TO THE RIGHT SHOULDER WITH MEDICATION AND THERAPEUTIC INJECTIONS WITH PERSISTENCE OF PAIN AND LIMITED FUNCTION.  Past Medical History  Diagnosis Date  . LSIL (low grade squamous intraepithelial lesion) on Pap smear 2010    Past Surgical History  Procedure Laterality Date  . Hammer toe surgery      left little toe  . Colposcopy  2010  . Colonoscopy      long time  . Breast surgery      core biopsy- L breast   . Tubal ligation    . Ectopic pregnancy surgery      x2    Family History  Problem Relation Age of Onset  . Hypertension Mother   . Hypertension Father   . Diabetes Father   . Heart disease Sister     congestive heart failure  . Thyroid disease Sister   . Hypertension Maternal Grandmother   . Diabetes Maternal Grandmother    Social History:  reports that she has never smoked. She has never used smokeless tobacco. She reports that she drinks alcohol. She reports that she does not use illicit drugs.  Allergies:  Allergies  Allergen Reactions  . Bee Venom Anaphylaxis  . Pollen Extract     Seasonal allergies   . Penicillins Rash    Swelling (not throat swelling), swelling in other parts of body    Medications Prior to Admission  Medication Sig Dispense Refill  . aspirin 81 MG chewable tablet Chew 81 mg by mouth daily.      . Cholecalciferol (VITAMIN D) 2000 UNITS CAPS Take 2,000 Units by mouth daily.      . ergocalciferol (VITAMIN D2) 50000 UNITS capsule Take 50,000 Units by mouth once a week. Mondays      . Naproxen-Esomeprazole (VIMOVO) 500-20 MG TBEC Take 1 tablet by mouth daily.      Marland Kitchen PARoxetine Mesylate 7.5 MG CAPS Take 7.5 mg by mouth at bedtime as needed (to help her sleep).      . rosuvastatin (CRESTOR) 10 MG tablet Take 10 mg by mouth daily before breakfast.         No results found for this or any previous visit  (from the past 48 hour(s)). No results found.  Review of Systems  Constitutional: Negative.   HENT: Negative.   Eyes: Negative.   Respiratory: Negative.   Cardiovascular: Negative.   Gastrointestinal: Negative.   Genitourinary: Negative.   Musculoskeletal: Positive for joint pain.  Skin: Negative.   Neurological: Negative.   Endo/Heme/Allergies: Negative.   Psychiatric/Behavioral: Negative.     Blood pressure 145/97, pulse 83, temperature 97.2 F (36.2 C), temperature source Oral, resp. rate 20, last menstrual period 12/03/2011, SpO2 100.00%. Physical Exam RIGHT SHOULDER TENDER SUBACROMIAL CREPITUS WITH LIMITED ACTIVE AND PASSIVE ROM. INCREASED PAIN ON ABDUCTION ABOVE 90% AND ON RESISTIVE ABDUCTION. MRI REVEAL ROTATOR CUFF TENOPATHY .  Assessment/Plan IMPINGEMENT SYNDROME RIGHT SHOULDER / PLAN ARTHROSCOPY AND ACROMIOPLASTY RIGHT  SHOULDER   Carr Shartzer F 09/19/2013, 7:49 AM

## 2013-09-20 ENCOUNTER — Encounter (HOSPITAL_COMMUNITY): Payer: Self-pay | Admitting: Orthopedic Surgery

## 2013-09-20 NOTE — Op Note (Signed)
NAMEMADGE, THERRIEN NO.:  [id number]  MEDICAL RECORD NO.:  [id number]  LOCATION:  [facility]                         FACILITY:  [facility]  PHYSICIAN:  Myrtie Neither, MD      DATE OF BIRTH:  Sep 29, 1961  DATE OF PROCEDURE:  09/19/2013 DATE OF DISCHARGE:  09/19/2013                              OPERATIVE REPORT   PREOPERATIVE DIAGNOSES:  Rotator cuff tendinopathy of the right shoulder; impingement syndrome, right shoulder.  POSTOPERATIVE DIAGNOSES:  Rotator cuff tendinopathy of the right shoulder; impingement syndrome, right shoulder.  ANESTHESIA:  General.  PROCEDURE:  Arthroscopic acromioplasty, right shoulder and synovectomy, right shoulder.  PROCEDURE IN DETAIL:  The patient was taken to the operating room. After given adequate preop medications, given general anesthesia and intubated.  Right shoulder was prepped with DuraPrep and draped in sterile manner.  The patient was placed in a barber chair position. Posterior incision was made over the right shoulder both through the skin and subcutaneous tissue.  __________was placed posterior to anterior __________.  A separate lateral incision was made for the arthroscope.  Inspection revealed hypertrophic overgrowth of the subacromial bursal sac, chondromalacia changes of the surface of the acromion as well as tendinopathy of the rotator cuff without complete tear.  With synovial shaver, complete synovectomy was done with the use of a burr acromioplasty.  After adequate resection of the  bursal sac of the acromion, copious irrigation was done.  Further inspection again demonstrates some  tendinopathy of the rotator cuff without complete tear.  Wound closure was then done with 4-0 nylon.  Compressive dressing was applied, and immobilizing sling applied.  The patient tolerated the procedure quite well, went to recovery room in stable and satisfactory condition.  The patient is being discharged with use of sling, ice  packs to the right shoulder, return to the office in 1 week.  The patient is being discharged with Percocet 1-2 every 4 p.r.n. for pain.  The patient is being discharged in stable and satisfactory condition.     Myrtie Neither, MD     AC/MEDQ  D:  09/19/2013  T:  09/20/2013  Job:  [id number]

## 2013-10-03 ENCOUNTER — Ambulatory Visit: Payer: BC Managed Care – PPO | Attending: Orthopedic Surgery | Admitting: Physical Therapy

## 2013-10-03 DIAGNOSIS — M25519 Pain in unspecified shoulder: Secondary | ICD-10-CM | POA: Insufficient documentation

## 2013-10-03 DIAGNOSIS — IMO0001 Reserved for inherently not codable concepts without codable children: Secondary | ICD-10-CM | POA: Insufficient documentation

## 2013-10-03 DIAGNOSIS — R293 Abnormal posture: Secondary | ICD-10-CM | POA: Insufficient documentation

## 2013-10-03 DIAGNOSIS — M25619 Stiffness of unspecified shoulder, not elsewhere classified: Secondary | ICD-10-CM | POA: Insufficient documentation

## 2013-10-08 ENCOUNTER — Ambulatory Visit: Payer: BC Managed Care – PPO | Admitting: Physical Therapy

## 2013-10-10 ENCOUNTER — Ambulatory Visit: Payer: BC Managed Care – PPO | Admitting: Rehabilitation

## 2013-10-14 ENCOUNTER — Ambulatory Visit: Payer: BC Managed Care – PPO | Admitting: Physical Therapy

## 2013-10-16 ENCOUNTER — Ambulatory Visit: Payer: BC Managed Care – PPO | Admitting: Rehabilitation

## 2013-10-22 ENCOUNTER — Ambulatory Visit: Payer: BC Managed Care – PPO | Attending: Orthopedic Surgery | Admitting: Rehabilitation

## 2013-10-22 DIAGNOSIS — M25519 Pain in unspecified shoulder: Secondary | ICD-10-CM | POA: Insufficient documentation

## 2013-10-22 DIAGNOSIS — M25619 Stiffness of unspecified shoulder, not elsewhere classified: Secondary | ICD-10-CM | POA: Insufficient documentation

## 2013-10-22 DIAGNOSIS — IMO0001 Reserved for inherently not codable concepts without codable children: Secondary | ICD-10-CM | POA: Insufficient documentation

## 2013-10-22 DIAGNOSIS — R293 Abnormal posture: Secondary | ICD-10-CM | POA: Insufficient documentation

## 2013-10-24 ENCOUNTER — Ambulatory Visit: Payer: BC Managed Care – PPO | Admitting: Rehabilitation

## 2013-10-29 ENCOUNTER — Ambulatory Visit: Payer: BC Managed Care – PPO | Admitting: Rehabilitation

## 2013-10-30 ENCOUNTER — Other Ambulatory Visit: Payer: Self-pay

## 2013-10-30 DIAGNOSIS — Z1231 Encounter for screening mammogram for malignant neoplasm of breast: Secondary | ICD-10-CM

## 2013-10-31 ENCOUNTER — Ambulatory Visit: Payer: BC Managed Care – PPO | Admitting: Physical Therapy

## 2013-11-05 ENCOUNTER — Ambulatory Visit: Payer: BC Managed Care – PPO | Admitting: Rehabilitation

## 2013-11-07 ENCOUNTER — Ambulatory Visit: Payer: BC Managed Care – PPO | Admitting: Rehabilitation

## 2013-11-11 ENCOUNTER — Ambulatory Visit: Payer: BC Managed Care – PPO | Admitting: Physical Therapy

## 2013-11-18 ENCOUNTER — Ambulatory Visit: Payer: BC Managed Care – PPO | Admitting: Physical Therapy

## 2013-11-20 ENCOUNTER — Encounter: Payer: BC Managed Care – PPO | Admitting: Rehabilitation

## 2013-12-13 ENCOUNTER — Ambulatory Visit
Admission: RE | Admit: 2013-12-13 | Discharge: 2013-12-13 | Disposition: A | Discharge status: Home / Self Care | Payer: BC Managed Care – PPO | Source: Ambulatory Visit

## 2013-12-13 DIAGNOSIS — Z1231 Encounter for screening mammogram for malignant neoplasm of breast: Secondary | ICD-10-CM

## 2014-09-22 ENCOUNTER — Encounter (HOSPITAL_COMMUNITY): Payer: Self-pay | Admitting: Orthopedic Surgery

## 2014-11-11 ENCOUNTER — Other Ambulatory Visit: Payer: Self-pay

## 2014-11-11 DIAGNOSIS — Z1231 Encounter for screening mammogram for malignant neoplasm of breast: Secondary | ICD-10-CM

## 2014-12-15 ENCOUNTER — Ambulatory Visit
Admission: RE | Admit: 2014-12-15 | Discharge: 2014-12-15 | Disposition: A | Discharge status: Home / Self Care | Payer: BLUE CROSS/BLUE SHIELD | Source: Ambulatory Visit

## 2014-12-15 DIAGNOSIS — Z1231 Encounter for screening mammogram for malignant neoplasm of breast: Secondary | ICD-10-CM

## 2015-11-12 ENCOUNTER — Other Ambulatory Visit: Payer: Self-pay

## 2015-11-12 DIAGNOSIS — Z1231 Encounter for screening mammogram for malignant neoplasm of breast: Secondary | ICD-10-CM

## 2015-12-18 ENCOUNTER — Ambulatory Visit
Admission: RE | Admit: 2015-12-18 | Discharge: 2015-12-18 | Disposition: A | Discharge status: Home / Self Care | Payer: BLUE CROSS/BLUE SHIELD | Source: Ambulatory Visit

## 2015-12-18 DIAGNOSIS — Z1231 Encounter for screening mammogram for malignant neoplasm of breast: Secondary | ICD-10-CM

## 2016-11-23 ENCOUNTER — Other Ambulatory Visit: Payer: Self-pay | Admitting: Obstetrics and Gynecology

## 2016-11-23 DIAGNOSIS — Z1231 Encounter for screening mammogram for malignant neoplasm of breast: Secondary | ICD-10-CM

## 2016-11-29 DIAGNOSIS — R7303 Prediabetes: Secondary | ICD-10-CM | POA: Insufficient documentation

## 2016-12-19 ENCOUNTER — Ambulatory Visit
Admission: RE | Admit: 2016-12-19 | Discharge: 2016-12-19 | Disposition: A | Discharge status: Home / Self Care | Payer: BLUE CROSS/BLUE SHIELD | Source: Ambulatory Visit | Attending: Obstetrics and Gynecology | Admitting: Obstetrics and Gynecology

## 2016-12-19 DIAGNOSIS — Z1231 Encounter for screening mammogram for malignant neoplasm of breast: Secondary | ICD-10-CM

## 2017-07-25 ENCOUNTER — Encounter: Payer: Self-pay | Admitting: Allergy and Immunology

## 2017-07-25 ENCOUNTER — Ambulatory Visit (INDEPENDENT_AMBULATORY_CARE_PROVIDER_SITE_OTHER): Payer: BLUE CROSS/BLUE SHIELD | Admitting: Allergy and Immunology

## 2017-07-25 VITALS — BP 112/80 | HR 74 | Resp 16

## 2017-07-25 DIAGNOSIS — T63441D Toxic effect of venom of bees, accidental (unintentional), subsequent encounter: Secondary | ICD-10-CM

## 2017-07-25 DIAGNOSIS — IMO0001 Reserved for inherently not codable concepts without codable children: Secondary | ICD-10-CM

## 2017-07-25 MED ORDER — EPINEPHRINE 0.3 MG/0.3ML IJ SOAJ: 0.3000 mg | Freq: Once | INTRAMUSCULAR | 1 refills | Status: AC

## 2017-07-25 NOTE — Patient Instructions (Addendum)
  1. Perform avoidance measures against Hymenoptera venom  2. Auvi-Q 0.3, Benadryl, M.D./ER evaluation for allergic reaction  3. Obtain annual flu vaccine every year  4. Return to clinic in 1 year or earlier if problem

## 2017-07-25 NOTE — Progress Notes (Signed)
Dear Dr. Parke Simmers,  Thank you for referring Amber Barber to the [facility] [place] on 07/25/2017.   Below is a summation of this patient's evaluation and recommendations.  Thank you for your referral. I will keep you informed about this patient's response to treatment.   If you have any questions please do not hesitate to contact me.   Sincerely,  Jessica Priest, MD Allergy / Immunology [facility]   ______________________________________________________________________    NEW PATIENT NOTE  Referring Provider: Renaye Rakers, MD Primary Provider: Renaye Rakers, MD Date of office visit: 07/25/2017    Subjective:   Chief Complaint:  Amber Barber (DOB: 07/31/1961) is a 56 y.o. female who presents to the clinic on 07/25/2017 with a chief complaint of New Patient (Initial Visit) and Insect Bite .     HPI: Amber Barber presents this clinic in evaluation of Hymenoptera venom hypersensitivity. Apparently we had seen her in this clinic almost 20 years ago for this issue.  At the age of 57 she was stung by some type of flying Hymenoptera and developed diffuse urticaria and lip swelling and maybe some breathing problems. She has been evaluated for this issue in the past and has elected to not undergo a course of immunotherapy. She is relatively good about avoiding hymenoptera exposure. Her EpiPen is out of expiration and she is here to get this refilled.  Past Medical History:  Diagnosis Date  . LSIL (low grade squamous intraepithelial lesion) on Pap smear 2010    Past Surgical History:  Procedure Laterality Date  . BREAST SURGERY     core biopsy- L breast   . COLONOSCOPY     long time  . COLPOSCOPY  2010  . ECTOPIC PREGNANCY SURGERY     x2  . HAMMER TOE SURGERY     left little toe  . SHOULDER ARTHROSCOPY Right 09/19/2013   Procedure: RIGHT ARTHROSCOPY SHOULDER WITH ACROMIOPLASTY ;  Surgeon: Kennieth Rad, MD;  Location: [facility] OR;  Service: Orthopedics;  Laterality: Right;  . TUBAL LIGATION      Allergies as of 07/25/2017      Reactions   Bee Venom Anaphylaxis   Pollen Extract    Seasonal allergies    Penicillins Rash   Swelling (not throat swelling), swelling in other parts of body      Medication List      ergocalciferol 50000 units capsule Commonly known as:  VITAMIN D2 Take 50,000 Units by mouth once a week. Mondays       Review of systems negative except as noted in HPI / PMHx or noted below:  Review of Systems  Constitutional: Negative.   HENT: Negative.   Eyes: Negative.   Respiratory: Negative.   Cardiovascular: Negative.   Gastrointestinal: Negative.   Genitourinary: Negative.   Musculoskeletal: Negative.   Skin: Negative.   Neurological: Negative.   Endo/Heme/Allergies: Negative.   Psychiatric/Behavioral: Negative.     Family History  Problem Relation Age of Onset  . Hypertension Mother   . Hypertension Father   . Diabetes Father   . Heart disease Sister        congestive heart failure  . Thyroid disease Sister   . Hypertension Maternal Grandmother   . Diabetes Maternal Grandmother     Social History   Social History  . Marital status: Married    Spouse name: N/A  . Number of children: N/A  .  Years of education: N/A   Occupational History  . Not on file.   Social History Main Topics  . Smoking status: Never Smoker  . Smokeless tobacco: Never Used  . Alcohol use Yes     Comment: social- "every now & ten"   . Drug use: No  . Sexual activity: Yes    Birth control/ protection: None   Other Topics Concern  . Not on file   Social History Narrative  . No narrative on file    Environmental and Social history  Lives in a house with a dry environment, no animals located inside the household, carpeting in the bedroom, plastic on the bed, plastic on the pillow, no smokers located inside the household. An appointment in an office  setting.  Objective:   Vitals:   07/25/17 1356  BP: 112/80  Pulse: 74  Resp: 16        Physical Exam  Constitutional: She is well-developed, well-nourished, and in no distress.  HENT:  Head: Normocephalic.  Right Ear: Tympanic membrane, external ear and ear canal normal.  Left Ear: Tympanic membrane, external ear and ear canal normal.  Nose: Nose normal. No mucosal edema or rhinorrhea.  Mouth/Throat: Uvula is midline, oropharynx is clear and moist and mucous membranes are normal. No oropharyngeal exudate.  Eyes: Conjunctivae are normal.  Neck: Trachea normal. No tracheal tenderness present. No tracheal deviation present. No thyromegaly present.  Cardiovascular: Normal rate, regular rhythm, S1 normal, S2 normal and normal heart sounds.   No murmur heard. Pulmonary/Chest: Breath sounds normal. No stridor. No respiratory distress. She has no wheezes. She has no rales.  Musculoskeletal: She exhibits no edema.  Lymphadenopathy:       Head (right side): No tonsillar adenopathy present.       Head (left side): No tonsillar adenopathy present.    She has no cervical adenopathy.  Neurological: She is alert. Gait normal.  Skin: No rash noted. She is not diaphoretic. No erythema. Nails show no clubbing.  Psychiatric: Mood and affect normal.    Diagnostics: none   Assessment and Plan:    1. Hymenoptera reaction, accidental or unintentional, subsequent encounter     1. Perform avoidance measures against Hymenoptera venom  2. Auvi-Q 0.3, Benadryl, M.D./ER evaluation for allergic reaction  3. Obtain annual flu vaccine every year  4. Return to clinic in 1 year or earlier if problem  Amber Barber will continue to perform allergen avoidance measures against Hymenoptera venom and if necessary utilize an EpiPen to abort progression of an allergic reaction. I will see her back in this clinic in 1 year or earlier if there is a problem. At this point she is not interested in undergoing a  course immunotherapy against Hymenoptera venom.  Jessica PriestEric J. Mike Berntsen, MD Allergy / Immunology [facility] [place]

## 2017-11-20 ENCOUNTER — Other Ambulatory Visit: Payer: Self-pay | Admitting: Obstetrics and Gynecology

## 2017-11-20 DIAGNOSIS — Z1231 Encounter for screening mammogram for malignant neoplasm of breast: Secondary | ICD-10-CM

## 2017-11-30 DIAGNOSIS — Z6827 Body mass index (BMI) 27.0-27.9, adult: Secondary | ICD-10-CM | POA: Diagnosis not present

## 2017-11-30 DIAGNOSIS — Z01419 Encounter for gynecological examination (general) (routine) without abnormal findings: Secondary | ICD-10-CM | POA: Diagnosis not present

## 2017-11-30 DIAGNOSIS — N898 Other specified noninflammatory disorders of vagina: Secondary | ICD-10-CM | POA: Diagnosis not present

## 2017-11-30 DIAGNOSIS — N950 Postmenopausal bleeding: Secondary | ICD-10-CM | POA: Diagnosis not present

## 2017-12-20 ENCOUNTER — Ambulatory Visit
Admission: RE | Admit: 2017-12-20 | Discharge: 2017-12-20 | Disposition: A | Discharge status: Home / Self Care | Payer: BLUE CROSS/BLUE SHIELD | Source: Ambulatory Visit | Attending: Obstetrics and Gynecology | Admitting: Obstetrics and Gynecology

## 2017-12-20 DIAGNOSIS — Z1231 Encounter for screening mammogram for malignant neoplasm of breast: Secondary | ICD-10-CM

## 2017-12-25 DIAGNOSIS — N951 Menopausal and female climacteric states: Secondary | ICD-10-CM | POA: Diagnosis not present

## 2017-12-25 DIAGNOSIS — L309 Dermatitis, unspecified: Secondary | ICD-10-CM | POA: Diagnosis not present

## 2017-12-25 DIAGNOSIS — E782 Mixed hyperlipidemia: Secondary | ICD-10-CM | POA: Diagnosis not present

## 2017-12-25 DIAGNOSIS — T7840XA Allergy, unspecified, initial encounter: Secondary | ICD-10-CM | POA: Diagnosis not present

## 2018-01-11 DIAGNOSIS — E782 Mixed hyperlipidemia: Secondary | ICD-10-CM | POA: Diagnosis not present

## 2018-01-11 DIAGNOSIS — R739 Hyperglycemia, unspecified: Secondary | ICD-10-CM | POA: Diagnosis not present

## 2018-01-17 DIAGNOSIS — L810 Postinflammatory hyperpigmentation: Secondary | ICD-10-CM | POA: Diagnosis not present

## 2018-01-17 DIAGNOSIS — L300 Nummular dermatitis: Secondary | ICD-10-CM | POA: Diagnosis not present

## 2018-01-17 DIAGNOSIS — L853 Xerosis cutis: Secondary | ICD-10-CM | POA: Diagnosis not present

## 2018-02-14 DIAGNOSIS — L300 Nummular dermatitis: Secondary | ICD-10-CM | POA: Diagnosis not present

## 2018-02-14 DIAGNOSIS — L853 Xerosis cutis: Secondary | ICD-10-CM | POA: Diagnosis not present

## 2018-02-14 DIAGNOSIS — L810 Postinflammatory hyperpigmentation: Secondary | ICD-10-CM | POA: Diagnosis not present

## 2018-05-03 DIAGNOSIS — I10 Essential (primary) hypertension: Secondary | ICD-10-CM | POA: Diagnosis not present

## 2018-05-03 DIAGNOSIS — R739 Hyperglycemia, unspecified: Secondary | ICD-10-CM | POA: Diagnosis not present

## 2018-05-03 DIAGNOSIS — E782 Mixed hyperlipidemia: Secondary | ICD-10-CM | POA: Diagnosis not present

## 2018-05-11 DIAGNOSIS — E782 Mixed hyperlipidemia: Secondary | ICD-10-CM | POA: Diagnosis not present

## 2018-05-11 DIAGNOSIS — R739 Hyperglycemia, unspecified: Secondary | ICD-10-CM | POA: Diagnosis not present

## 2018-05-11 DIAGNOSIS — L309 Dermatitis, unspecified: Secondary | ICD-10-CM | POA: Diagnosis not present

## 2018-07-31 ENCOUNTER — Telehealth: Payer: Self-pay | Admitting: *Deleted

## 2018-07-31 ENCOUNTER — Ambulatory Visit: Payer: BLUE CROSS/BLUE SHIELD | Admitting: Allergy and Immunology

## 2018-07-31 ENCOUNTER — Encounter: Payer: Self-pay | Admitting: Allergy and Immunology

## 2018-07-31 VITALS — BP 120/80 | HR 76 | Resp 16

## 2018-07-31 DIAGNOSIS — R490 Dysphonia: Secondary | ICD-10-CM | POA: Diagnosis not present

## 2018-07-31 DIAGNOSIS — T63441D Toxic effect of venom of bees, accidental (unintentional), subsequent encounter: Secondary | ICD-10-CM | POA: Diagnosis not present

## 2018-07-31 DIAGNOSIS — IMO0001 Reserved for inherently not codable concepts without codable children: Secondary | ICD-10-CM

## 2018-07-31 MED ORDER — EPINEPHRINE 0.3 MG/0.3ML IJ SOAJ: INTRAMUSCULAR | 1 refills | Status: DC

## 2018-07-31 NOTE — Patient Instructions (Signed)
  1. Perform avoidance measures against Hymenoptera venom  2. Auvi-Q 0.3, Benadryl, M.D./ER evaluation for allergic reaction  3. Obtain annual flu vaccine every year  4. Evaluation of throat with ENT  5. Return to clinic in 1 year or earlier if problem

## 2018-07-31 NOTE — Progress Notes (Signed)
Follow-up Note  Referring Provider: Renaye Rakers, MD Primary Provider: Renaye Rakers, MD Date of Office Visit: 07/31/2018  Subjective:   Amber Barber (DOB: [birth year]) is a 57 y.o. female who [facility] on 07/31/2018 in re-evaluation of the following:  HPI: Delvina returns to this clinic in evaluation of Amber Barber hymenoptera venom hypersensitivity state.  Amber Barber last visit to this clinic was 25 July 2017.  Mariane has elected to not undergo a course of immunotherapy directed against hymenoptera venom and Amber Barber would like a refill on Amber Barber injectable epinephrine device.  Amber Barber has not been stung since being seen in this clinic.  Monserrate also has an issue with hoarseness.  Amber Barber has noticed that whenever Amber Barber projects Amber Barber voice Amber Barber gets extremely hoarse.  This has been a long-standing issue but may be somewhat progressive.  Amber Barber does have some throat clearing.  Amber Barber has no issues suggesting reflux.  Allergies as of 07/31/2018      Reactions   Bee Venom Anaphylaxis   Pollen Extract    Seasonal allergies    Penicillins Rash   Swelling (not throat swelling), swelling in other parts of body      Medication List      COMBIPATCH 0.05-0.25 MG/DAY Generic drug:  estradiol-norethindrone CombiPatch 0.05 mg-0.25 mg/24 hr transdermal  apply 1 patch as directed (below the waist) to abdomen or back, twice a week   CRESTOR 10 MG tablet Generic drug:  rosuvastatin Crestor 10 mg tablet   EPINEPHrine 0.3 mg/0.3 mL Soaj injection Commonly known as:  EPI-PEN Auvi-Q 0.3 mg/0.3 mL injection, auto-injector   ergocalciferol 50000 units capsule Commonly known as:  VITAMIN D2 Take 50,000 Units by mouth once a week. Mondays       Past Medical History:  Diagnosis Date  . LSIL (low grade squamous intraepithelial lesion) on Pap smear 2010    Past Surgical History:  Procedure Laterality Date  . BREAST BIOPSY Bilateral 2010,2012   benign  . BREAST SURGERY     core biopsy- L  breast   . COLONOSCOPY     long time  . COLPOSCOPY  2010  . ECTOPIC PREGNANCY SURGERY     x2  . HAMMER TOE SURGERY     left little toe  . SHOULDER ARTHROSCOPY Right 09/19/2013   Procedure: RIGHT ARTHROSCOPY SHOULDER WITH ACROMIOPLASTY ;  Surgeon: Kennieth Rad, MD;  Location: [facility] OR;  Service: Orthopedics;  Laterality: Right;  . TUBAL LIGATION      Review of systems negative except as noted in HPI / PMHx or noted below:  Review of Systems  Constitutional: Negative.   HENT: Negative.   Eyes: Negative.   Respiratory: Negative.   Cardiovascular: Negative.   Gastrointestinal: Negative.   Genitourinary: Negative.   Musculoskeletal: Negative.   Skin: Negative.   Neurological: Negative.   Endo/Heme/Allergies: Negative.   Psychiatric/Behavioral: Negative.      Objective:   Vitals:   07/31/18 1104  BP: 120/80  Pulse: 76  Resp: 16          Physical Exam  Constitutional:  Raspy voice  HENT:  Head: Normocephalic. Head is without right periorbital erythema and without left periorbital erythema.  Right Ear: Tympanic membrane, external ear and ear canal normal.  Left Ear: Tympanic membrane, external ear and ear canal normal.  Nose: Nose normal. No mucosal edema or rhinorrhea.  Mouth/Throat: Oropharynx is clear and moist and mucous membranes are normal. No oropharyngeal exudate.  Eyes: Pupils  are equal, round, and reactive to light. Conjunctivae and lids are normal.  Neck: Trachea normal. No tracheal deviation present. No thyromegaly present.  Cardiovascular: Normal rate, regular rhythm, S1 normal, S2 normal and normal heart sounds.  No murmur heard. Pulmonary/Chest: Effort normal. No stridor. No respiratory distress. Amber Barber has no wheezes. Amber Barber has no rales. Amber Barber exhibits no tenderness.  Abdominal: Soft. Amber Barber exhibits no distension and no mass. There is no hepatosplenomegaly. There is no tenderness. There is no rebound and no guarding.  Musculoskeletal: Amber Barber exhibits no edema or  tenderness.  Lymphadenopathy:       Head (right side): No tonsillar adenopathy present.       Head (left side): No tonsillar adenopathy present.    Amber Barber has no cervical adenopathy.    Amber Barber has no axillary adenopathy.  Neurological: Amber Barber is alert.  Skin: No rash noted. Amber Barber is not diaphoretic. No erythema. No pallor. Nails show no clubbing.    Diagnostics: none  Assessment and Plan:   1. Hymenoptera reaction, accidental or unintentional, subsequent encounter   2. Hoarseness     1. Perform avoidance measures against Hymenoptera venom  2. Auvi-Q 0.3, Benadryl, M.D./ER evaluation for allergic reaction  3. Obtain annual flu vaccine every year  4. Evaluation of throat with ENT  5. Return to clinic in 1 year or earlier if problem  Leelu appears to be doing well and I have refilled Amber Barber injectable epinephrine device.  Of course, Amber Barber has the option of starting a course of immunotherapy should Amber Barber so desire in the future.  Amber Barber has a very raspy voice and recurrent hoarseness and will get Amber Barber throat evaluated with the ENT.  I will see Amber Barber back in his clinic in 1 year or earlier if there is a problem.  Laurette Schimke, MD Allergy / Immunology [facility]

## 2018-07-31 NOTE — Telephone Encounter (Signed)
Please refer to ENT for hoarseness per Dr Lucie Leather Dr Danice Goltz

## 2018-08-01 ENCOUNTER — Encounter: Payer: Self-pay | Admitting: Allergy and Immunology

## 2018-08-02 NOTE — Telephone Encounter (Signed)
noted 

## 2018-08-02 NOTE — Telephone Encounter (Signed)
Referral placed in proficient to Dr Teohs office.  

## 2018-09-05 DIAGNOSIS — E782 Mixed hyperlipidemia: Secondary | ICD-10-CM | POA: Diagnosis not present

## 2018-09-05 DIAGNOSIS — R739 Hyperglycemia, unspecified: Secondary | ICD-10-CM | POA: Diagnosis not present

## 2018-09-05 DIAGNOSIS — I10 Essential (primary) hypertension: Secondary | ICD-10-CM | POA: Diagnosis not present

## 2018-09-05 DIAGNOSIS — E118 Type 2 diabetes mellitus with unspecified complications: Secondary | ICD-10-CM | POA: Diagnosis not present

## 2018-09-10 DIAGNOSIS — Z6828 Body mass index (BMI) 28.0-28.9, adult: Secondary | ICD-10-CM | POA: Diagnosis not present

## 2018-09-10 DIAGNOSIS — R739 Hyperglycemia, unspecified: Secondary | ICD-10-CM | POA: Diagnosis not present

## 2018-09-10 DIAGNOSIS — E782 Mixed hyperlipidemia: Secondary | ICD-10-CM | POA: Diagnosis not present

## 2018-09-12 DIAGNOSIS — R490 Dysphonia: Secondary | ICD-10-CM | POA: Diagnosis not present

## 2018-09-12 DIAGNOSIS — J310 Chronic rhinitis: Secondary | ICD-10-CM | POA: Diagnosis not present

## 2018-09-12 DIAGNOSIS — H6121 Impacted cerumen, right ear: Secondary | ICD-10-CM | POA: Diagnosis not present

## 2018-09-12 DIAGNOSIS — K219 Gastro-esophageal reflux disease without esophagitis: Secondary | ICD-10-CM | POA: Diagnosis not present

## 2018-09-12 DIAGNOSIS — J338 Other polyp of sinus: Secondary | ICD-10-CM | POA: Diagnosis not present

## 2018-11-19 ENCOUNTER — Other Ambulatory Visit: Payer: Self-pay | Admitting: Obstetrics and Gynecology

## 2018-11-19 DIAGNOSIS — Z1231 Encounter for screening mammogram for malignant neoplasm of breast: Secondary | ICD-10-CM

## 2018-12-05 DIAGNOSIS — Z6827 Body mass index (BMI) 27.0-27.9, adult: Secondary | ICD-10-CM | POA: Diagnosis not present

## 2018-12-05 DIAGNOSIS — N926 Irregular menstruation, unspecified: Secondary | ICD-10-CM | POA: Diagnosis not present

## 2018-12-05 DIAGNOSIS — Z01419 Encounter for gynecological examination (general) (routine) without abnormal findings: Secondary | ICD-10-CM | POA: Diagnosis not present

## 2018-12-21 ENCOUNTER — Ambulatory Visit
Admission: RE | Admit: 2018-12-21 | Discharge: 2018-12-21 | Disposition: A | Discharge status: Home / Self Care | Payer: BLUE CROSS/BLUE SHIELD | Source: Ambulatory Visit | Attending: Obstetrics and Gynecology | Admitting: Obstetrics and Gynecology

## 2018-12-21 DIAGNOSIS — Z1231 Encounter for screening mammogram for malignant neoplasm of breast: Secondary | ICD-10-CM

## 2019-02-28 DIAGNOSIS — E782 Mixed hyperlipidemia: Secondary | ICD-10-CM | POA: Diagnosis not present

## 2019-02-28 DIAGNOSIS — R739 Hyperglycemia, unspecified: Secondary | ICD-10-CM | POA: Diagnosis not present

## 2019-02-28 DIAGNOSIS — Z6828 Body mass index (BMI) 28.0-28.9, adult: Secondary | ICD-10-CM | POA: Diagnosis not present

## 2019-02-28 DIAGNOSIS — E0800 Diabetes mellitus due to underlying condition with hyperosmolarity without nonketotic hyperglycemic-hyperosmolar coma (NKHHC): Secondary | ICD-10-CM | POA: Diagnosis not present

## 2019-03-05 DIAGNOSIS — E1169 Type 2 diabetes mellitus with other specified complication: Secondary | ICD-10-CM | POA: Diagnosis not present

## 2019-03-05 DIAGNOSIS — E785 Hyperlipidemia, unspecified: Secondary | ICD-10-CM | POA: Diagnosis not present

## 2019-04-25 DIAGNOSIS — N898 Other specified noninflammatory disorders of vagina: Secondary | ICD-10-CM | POA: Diagnosis not present

## 2019-07-19 ENCOUNTER — Other Ambulatory Visit: Payer: Self-pay

## 2019-07-19 DIAGNOSIS — N950 Postmenopausal bleeding: Secondary | ICD-10-CM | POA: Diagnosis not present

## 2019-07-19 DIAGNOSIS — N939 Abnormal uterine and vaginal bleeding, unspecified: Secondary | ICD-10-CM | POA: Diagnosis not present

## 2019-07-19 DIAGNOSIS — N840 Polyp of corpus uteri: Secondary | ICD-10-CM | POA: Diagnosis not present

## 2019-07-23 DIAGNOSIS — Z1211 Encounter for screening for malignant neoplasm of colon: Secondary | ICD-10-CM | POA: Diagnosis not present

## 2019-07-23 DIAGNOSIS — Z86010 Personal history of colonic polyps: Secondary | ICD-10-CM | POA: Diagnosis not present

## 2019-07-23 DIAGNOSIS — K5730 Diverticulosis of large intestine without perforation or abscess without bleeding: Secondary | ICD-10-CM | POA: Diagnosis not present

## 2019-08-08 DIAGNOSIS — I10 Essential (primary) hypertension: Secondary | ICD-10-CM | POA: Diagnosis not present

## 2019-08-08 DIAGNOSIS — R0789 Other chest pain: Secondary | ICD-10-CM | POA: Diagnosis not present

## 2019-08-08 DIAGNOSIS — N951 Menopausal and female climacteric states: Secondary | ICD-10-CM | POA: Diagnosis not present

## 2019-08-08 DIAGNOSIS — F064 Anxiety disorder due to known physiological condition: Secondary | ICD-10-CM | POA: Diagnosis not present

## 2019-08-08 DIAGNOSIS — R739 Hyperglycemia, unspecified: Secondary | ICD-10-CM | POA: Diagnosis not present

## 2019-08-28 DIAGNOSIS — Z800 Family history of malignant neoplasm of digestive organs: Secondary | ICD-10-CM | POA: Diagnosis not present

## 2019-08-28 DIAGNOSIS — D123 Benign neoplasm of transverse colon: Secondary | ICD-10-CM | POA: Diagnosis not present

## 2019-08-28 DIAGNOSIS — K635 Polyp of colon: Secondary | ICD-10-CM | POA: Diagnosis not present

## 2019-08-28 DIAGNOSIS — K621 Rectal polyp: Secondary | ICD-10-CM | POA: Diagnosis not present

## 2019-08-28 DIAGNOSIS — D128 Benign neoplasm of rectum: Secondary | ICD-10-CM | POA: Diagnosis not present

## 2019-08-28 DIAGNOSIS — Z1211 Encounter for screening for malignant neoplasm of colon: Secondary | ICD-10-CM | POA: Diagnosis not present

## 2019-08-28 DIAGNOSIS — K5732 Diverticulitis of large intestine without perforation or abscess without bleeding: Secondary | ICD-10-CM | POA: Diagnosis not present

## 2019-08-30 DIAGNOSIS — Z03818 Encounter for observation for suspected exposure to other biological agents ruled out: Secondary | ICD-10-CM | POA: Diagnosis not present

## 2019-09-05 DIAGNOSIS — F064 Anxiety disorder due to known physiological condition: Secondary | ICD-10-CM | POA: Diagnosis not present

## 2019-09-05 DIAGNOSIS — E782 Mixed hyperlipidemia: Secondary | ICD-10-CM | POA: Diagnosis not present

## 2019-09-05 DIAGNOSIS — E1169 Type 2 diabetes mellitus with other specified complication: Secondary | ICD-10-CM | POA: Diagnosis not present

## 2019-09-05 DIAGNOSIS — I10 Essential (primary) hypertension: Secondary | ICD-10-CM | POA: Diagnosis not present

## 2019-09-05 DIAGNOSIS — D7219 Other eosinophilia: Secondary | ICD-10-CM | POA: Diagnosis not present

## 2019-10-29 ENCOUNTER — Other Ambulatory Visit: Payer: Self-pay

## 2019-10-29 DIAGNOSIS — Z20822 Contact with and (suspected) exposure to covid-19: Secondary | ICD-10-CM

## 2019-10-30 LAB — NOVEL CORONAVIRUS, NAA: SARS-CoV-2, NAA: NOT DETECTED

## 2019-11-18 ENCOUNTER — Other Ambulatory Visit: Payer: Self-pay | Admitting: Obstetrics and Gynecology

## 2019-11-18 DIAGNOSIS — Z1231 Encounter for screening mammogram for malignant neoplasm of breast: Secondary | ICD-10-CM

## 2019-11-26 ENCOUNTER — Ambulatory Visit: Payer: BC Managed Care – PPO | Attending: Internal Medicine

## 2019-11-26 DIAGNOSIS — Z20822 Contact with and (suspected) exposure to covid-19: Secondary | ICD-10-CM | POA: Diagnosis not present

## 2019-11-28 LAB — NOVEL CORONAVIRUS, NAA: SARS-CoV-2, NAA: NOT DETECTED

## 2019-12-20 DIAGNOSIS — E119 Type 2 diabetes mellitus without complications: Secondary | ICD-10-CM | POA: Insufficient documentation

## 2019-12-20 DIAGNOSIS — Z6826 Body mass index (BMI) 26.0-26.9, adult: Secondary | ICD-10-CM | POA: Diagnosis not present

## 2019-12-20 DIAGNOSIS — Z01419 Encounter for gynecological examination (general) (routine) without abnormal findings: Secondary | ICD-10-CM | POA: Diagnosis not present

## 2019-12-20 DIAGNOSIS — Z124 Encounter for screening for malignant neoplasm of cervix: Secondary | ICD-10-CM | POA: Diagnosis not present

## 2019-12-26 ENCOUNTER — Ambulatory Visit: Payer: BLUE CROSS/BLUE SHIELD

## 2020-01-02 ENCOUNTER — Encounter (HOSPITAL_COMMUNITY): Payer: Self-pay

## 2020-01-02 ENCOUNTER — Other Ambulatory Visit: Payer: Self-pay

## 2020-01-02 ENCOUNTER — Emergency Department (HOSPITAL_COMMUNITY)
Admission: EM | Admit: 2020-01-02 | Discharge: 2020-01-02 | Disposition: A | Discharge status: Home / Self Care | Payer: BC Managed Care – PPO | Attending: Emergency Medicine | Admitting: Emergency Medicine

## 2020-01-02 ENCOUNTER — Ambulatory Visit
Admission: RE | Admit: 2020-01-02 | Discharge: 2020-01-02 | Disposition: A | Discharge status: Home / Self Care | Payer: BC Managed Care – PPO | Source: Ambulatory Visit | Attending: Obstetrics and Gynecology | Admitting: Obstetrics and Gynecology

## 2020-01-02 DIAGNOSIS — Z1231 Encounter for screening mammogram for malignant neoplasm of breast: Secondary | ICD-10-CM

## 2020-01-02 DIAGNOSIS — Z79899 Other long term (current) drug therapy: Secondary | ICD-10-CM | POA: Insufficient documentation

## 2020-01-02 DIAGNOSIS — K5900 Constipation, unspecified: Secondary | ICD-10-CM | POA: Diagnosis not present

## 2020-01-02 LAB — URINALYSIS, ROUTINE W REFLEX MICROSCOPIC
Bilirubin Urine: NEGATIVE
Glucose, UA: >=500 mg/dL — AB
Hgb urine dipstick: NEGATIVE
Ketones, ur: NEGATIVE mg/dL
Leukocytes,Ua: NEGATIVE
Nitrite: NEGATIVE
Protein, ur: NEGATIVE mg/dL
Specific Gravity, Urine: 1.027 (ref 1.005–1.030)
pH: 5 (ref 5.0–8.0)

## 2020-01-02 LAB — POC OCCULT BLOOD, ED: Fecal Occult Bld: NEGATIVE

## 2020-01-02 MED ORDER — SODIUM CHLORIDE 0.9 % IV BOLUS: 1000.0000 mL | Freq: Once | INTRAVENOUS | Status: DC

## 2020-01-02 NOTE — Discharge Instructions (Addendum)
Mix 4 doses of miralax into 32 oz of a sports drink like Gatorade. Drink over 2 hours or until you have a good bowel movement. You may repeat once more if needed. Please do this daily for the next week. You may slowly decrease miralax based on stool consistency.   Please increase fiber intake, water/hydration and exercise. This will ultimately improve constipation symptoms.

## 2020-01-02 NOTE — ED Notes (Signed)
Per ED provider: Hold IV and lab collect. Allow pt to attempt to have a BM.

## 2020-01-02 NOTE — ED Notes (Signed)
Pt provided with blanket. Pt resting in bed comfortably. NAD noted

## 2020-01-02 NOTE — ED Notes (Signed)
Pt provided with water

## 2020-01-02 NOTE — ED Provider Notes (Signed)
[facility]-EMERGENCY DEPT Provider Note   CSN: [id number] Arrival date & time: 01/02/20  1027     History Chief Complaint  Patient presents with  . Constipation    Amber Barber is a 59 y.o. female.  HPI  Patient is a 58 year old female with a history of intermittent constipation for the past several years on a statin for hyperlipidemia but no other significant medications presented today for constipation for the past 4 days.  She states that she had her last bowel movement on Tuesday which was hard and since then she has only passed small ""stool.  She endorses rectal pain but denies any abdominal pain, chest pain, nausea or vomiting.  She also denies any fevers, chills, pain with defecation, lumps or swelling or any drainage from rectum.  Denies any medication changes.  Denies any narcotic pain medications or medications other than statin which she takes.  Patient states that she has taken Dulcolax yesterday was taken no other over-the-counter medications.     Past Medical History:  Diagnosis Date  . LSIL (low grade squamous intraepithelial lesion) on Pap smear 2010    There are no problems to display for this patient.   Past Surgical History:  Procedure Laterality Date  . BREAST BIOPSY Bilateral 2010,2012   benign  . BREAST SURGERY     core biopsy- L breast   . COLONOSCOPY     long time  . COLPOSCOPY  2010  . ECTOPIC PREGNANCY SURGERY     x2  . HAMMER TOE SURGERY     left little toe  . SHOULDER ARTHROSCOPY Right 09/19/2013   Procedure: RIGHT ARTHROSCOPY SHOULDER WITH ACROMIOPLASTY ;  Surgeon: Kennieth Rad, MD;  Location: [facility] OR;  Service: Orthopedics;  Laterality: Right;  . TUBAL LIGATION       OB History    Gravida  3   Para      Term      Preterm      AB      Living  1     SAB      TAB      Ectopic      Multiple      Live Births              Family History  Problem Relation Age of Onset  . Hypertension  Mother   . Hypertension Father   . Diabetes Father   . Heart disease Sister        congestive heart failure  . Thyroid disease Sister   . Hypertension Maternal Grandmother   . Diabetes Maternal Grandmother     Social History   Tobacco Use  . Smoking status: Never Smoker  . Smokeless tobacco: Never Used  Substance Use Topics  . Alcohol use: Yes    Comment: social- "every now & ten"   . Drug use: No    Home Medications Prior to Admission medications   Medication Sig Start Date End Date Taking? Authorizing Provider  EPINEPHrine 0.3 mg/0.3 mL IJ SOAJ injection Auvi-Q 0.3 mg/0.3 mL injection, auto-injector 07/31/18   Kozlow, Alvira Philips, MD  ergocalciferol (VITAMIN D2) 50000 UNITS capsule Take 50,000 Units by mouth once a week. Mondays    [provider]  estradiol-norethindrone [facility]) 0.05-0.25 MG/DAY CombiPatch 0.05 mg-0.25 mg/24 hr transdermal  apply 1 patch as directed (below the waist) to abdomen or back, twice a week    [provider]  rosuvastatin (CRESTOR) 10 MG tablet Crestor 10  mg tablet    [provider]    Allergies    Bee venom, Pollen extract, and Penicillins  Review of Systems   Review of Systems  Constitutional: Negative for chills and fever.  HENT: Negative for congestion.   Eyes: Negative for pain.  Respiratory: Negative for cough and shortness of breath.   Cardiovascular: Negative for chest pain and leg swelling.  Gastrointestinal: Positive for constipation. Negative for abdominal pain and vomiting.  Genitourinary: Negative for dysuria.  Musculoskeletal: Negative for myalgias.  Skin: Negative for rash.  Neurological: Negative for dizziness and headaches.    Physical Exam Updated Vital Signs BP (!) 101/52 (BP Location: Left Arm)   Pulse 70   Temp 97.7 F (36.5 C) (Oral)   Resp 16   LMP 12/03/2011   SpO2 100%   Physical Exam Vitals and nursing note reviewed.  Constitutional:      General: She is in acute distress.       Comments: Patient is rolling out of bed and apparent distress.  She is pleasant and able to answer questions properly follow commands.  HENT:     Head: Normocephalic and atraumatic.     Nose: Nose normal.     Mouth/Throat:     Mouth: Mucous membranes are dry.  Eyes:     General: No scleral icterus. Cardiovascular:     Rate and Rhythm: Normal rate and regular rhythm.     Pulses: Normal pulses.     Heart sounds: Normal heart sounds.  Pulmonary:     Effort: Pulmonary effort is normal. No respiratory distress.     Breath sounds: No wheezing.  Abdominal:     Palpations: Abdomen is soft.     Tenderness: There is no abdominal tenderness. There is no right CVA tenderness, left CVA tenderness, guarding or rebound.  Genitourinary:    Comments: Copious stool removed from rectal vault.  Symptoms dramatically improved at disimpaction.  Stool is soft and brown.  No melena or hematochezia evident.  Guaiac negative Musculoskeletal:     Cervical back: Normal range of motion.     Right lower leg: No edema.     Left lower leg: No edema.  Skin:    General: Skin is warm and dry.     Capillary Refill: Capillary refill takes less than 2 seconds.  Neurological:     Mental Status: She is alert. Mental status is at baseline.  Psychiatric:        Mood and Affect: Mood normal.        Behavior: Behavior normal.     ED Results / Procedures / Treatments   Labs (all labs ordered are listed, but only abnormal results are displayed) Labs Reviewed  URINALYSIS, ROUTINE W REFLEX MICROSCOPIC - Abnormal; Notable for the following components:      Result Value   Glucose, UA >=500 (*)    Bacteria, UA RARE (*)    All other components within normal limits  POC OCCULT BLOOD, ED    EKG None  Radiology No results found.  Procedures Procedures (including critical care time)  Medications Ordered in ED Medications - No data to display  ED Course  I have reviewed the triage vital signs and the nursing  notes.  Pertinent labs & imaging results that were available during my care of the patient were reviewed by me and considered in my medical decision making (see chart for details).    MDM Rules/Calculators/A&P  Patient is 58 year old female with chief complaint of constipation.  She denies any rectal bleeding, history of hemorrhoids or rectal fistulas.  Or abscesses.  She has no specific symptoms today no abdominal pain, chest pain or nausea.  She denies any heat or cold intolerance consistent with thyroid disease.  She denies any changes in her skin or hair as well.  I discussed the case with my attending physician Dr. Charm Barges including patient's presenting symptoms, physical exam, and planned diagnostics and interventions. Attending physician stated agreement with plan or made changes to plan which were implemented.   Patient successfully disimpacted.  She feels much improved after disimpaction. Lengthy shared decision making a physician with patient about management going forward.  She will take Dulcolax daily as needed as well as MiraLAX daily which we titrated to normal stool consistency.  I counseled patient on dietary changes, exercise and hydration.  She is agreeable to plan.  Vital signs are within normal limits.   Final Clinical Impression(s) / ED Diagnoses Final diagnoses:  Constipation, unspecified constipation type   MiraLAX and Dulcolax  Rx / DC Orders ED Discharge Orders    None       Gailen Shelter, [place] 01/02/20 1349    Terrilee Files, MD 01/02/20 1939

## 2020-01-02 NOTE — ED Triage Notes (Signed)
Pt presents with c/o constipation. Pt reports she did have a bowel movement yesterday but only a very small amount and the stool was hard in nature. Pt reports pain in her rectum at this time.

## 2020-01-06 DIAGNOSIS — E119 Type 2 diabetes mellitus without complications: Secondary | ICD-10-CM | POA: Diagnosis not present

## 2020-01-06 DIAGNOSIS — R7309 Other abnormal glucose: Secondary | ICD-10-CM | POA: Diagnosis not present

## 2020-01-06 DIAGNOSIS — E782 Mixed hyperlipidemia: Secondary | ICD-10-CM | POA: Diagnosis not present

## 2020-01-06 DIAGNOSIS — R799 Abnormal finding of blood chemistry, unspecified: Secondary | ICD-10-CM | POA: Diagnosis not present

## 2020-01-07 DIAGNOSIS — E1169 Type 2 diabetes mellitus with other specified complication: Secondary | ICD-10-CM | POA: Diagnosis not present

## 2020-01-07 DIAGNOSIS — I10 Essential (primary) hypertension: Secondary | ICD-10-CM | POA: Diagnosis not present

## 2020-01-07 DIAGNOSIS — F064 Anxiety disorder due to known physiological condition: Secondary | ICD-10-CM | POA: Diagnosis not present

## 2020-01-07 DIAGNOSIS — E782 Mixed hyperlipidemia: Secondary | ICD-10-CM | POA: Diagnosis not present

## 2020-01-30 DIAGNOSIS — Z03818 Encounter for observation for suspected exposure to other biological agents ruled out: Secondary | ICD-10-CM | POA: Diagnosis not present

## 2020-01-30 DIAGNOSIS — Z20828 Contact with and (suspected) exposure to other viral communicable diseases: Secondary | ICD-10-CM | POA: Diagnosis not present

## 2020-03-05 DIAGNOSIS — E1169 Type 2 diabetes mellitus with other specified complication: Secondary | ICD-10-CM | POA: Diagnosis not present

## 2020-03-05 DIAGNOSIS — I10 Essential (primary) hypertension: Secondary | ICD-10-CM | POA: Diagnosis not present

## 2020-03-05 DIAGNOSIS — R7309 Other abnormal glucose: Secondary | ICD-10-CM | POA: Diagnosis not present

## 2020-03-05 DIAGNOSIS — E782 Mixed hyperlipidemia: Secondary | ICD-10-CM | POA: Diagnosis not present

## 2020-03-06 DIAGNOSIS — E1169 Type 2 diabetes mellitus with other specified complication: Secondary | ICD-10-CM | POA: Diagnosis not present

## 2020-03-06 DIAGNOSIS — I10 Essential (primary) hypertension: Secondary | ICD-10-CM | POA: Diagnosis not present

## 2020-07-07 DIAGNOSIS — E785 Hyperlipidemia, unspecified: Secondary | ICD-10-CM | POA: Diagnosis not present

## 2020-07-07 DIAGNOSIS — E1169 Type 2 diabetes mellitus with other specified complication: Secondary | ICD-10-CM | POA: Diagnosis not present

## 2020-07-07 DIAGNOSIS — I10 Essential (primary) hypertension: Secondary | ICD-10-CM | POA: Diagnosis not present

## 2020-07-07 DIAGNOSIS — F064 Anxiety disorder due to known physiological condition: Secondary | ICD-10-CM | POA: Diagnosis not present

## 2020-09-06 NOTE — Patient Instructions (Signed)
Hymenoptra reaction Avoid stinging insects. In case of an allergic reaction, give Benadryl 4 teaspoonfuls every 4 hours, and if life-threatening symptoms occur, inject with AuviQ 0.3 mg.  Hoarseness Continue to follow with ENT as needed  Please let us know if this treatment plan is not working well for you Schedule follow up appointment in 1 year

## 2020-09-07 ENCOUNTER — Ambulatory Visit (INDEPENDENT_AMBULATORY_CARE_PROVIDER_SITE_OTHER): Payer: BC Managed Care – PPO | Admitting: Family

## 2020-09-07 ENCOUNTER — Encounter: Payer: Self-pay | Admitting: Family

## 2020-09-07 ENCOUNTER — Other Ambulatory Visit: Payer: Self-pay

## 2020-09-07 VITALS — BP 118/78 | HR 68 | Temp 98.1°F | Resp 16 | Ht 68.0 in | Wt 180.0 lb

## 2020-09-07 DIAGNOSIS — R490 Dysphonia: Secondary | ICD-10-CM

## 2020-09-07 DIAGNOSIS — T782XXD Anaphylactic shock, unspecified, subsequent encounter: Secondary | ICD-10-CM

## 2020-09-07 DIAGNOSIS — T63481D Toxic effect of venom of other arthropod, accidental (unintentional), subsequent encounter: Secondary | ICD-10-CM

## 2020-09-07 MED ORDER — EPINEPHRINE 0.3 MG/0.3ML IJ SOAJ: INTRAMUSCULAR | 1 refills | Status: DC

## 2020-09-07 NOTE — Progress Notes (Signed)
[address] Debbora Presto [place] [address] Dept: [phone]  FOLLOW UP NOTE  Patient ID: Amber Barber, female    DOB: 08/26/1961  Age: 59 y.o. MRN: [id number] Date of Office Visit: 09/07/2020  Assessment  Chief Complaint: Allergies  HPI Amber Barber is a 59 year old female who presents today for follow-up of hymenoptera reaction and hoarseness.  She was last seen on July 31, 2018 by Dr. Lucie Leather.  Since her last office visit she has avoided bee stings and has not had to use her Auvi-Q.  She would like a refill on her Auvi-Q device.  She reports that her hoarseness is better since her last office visit and that she did go see ENT.  She reports that they instructed her to follow-up if anything changes.  And she reports that she has not needed to go back.  Current medications are as listed in the chart.   Drug Allergies:  Allergies  Allergen Reactions  . Bee Venom Anaphylaxis  . Pollen Extract     Seasonal allergies   . Penicillins Rash    Swelling (not throat swelling), swelling in other parts of body    Review of Systems: Review of Systems  Constitutional: Negative for chills and fever.  HENT:       Denies rhinorrhea, post nasal drip and nasal congestion  Eyes:       Reports occasional itchy watery eyes  Respiratory: Negative for cough, shortness of breath and wheezing.   Cardiovascular: Negative for chest pain and palpitations.  Gastrointestinal: Negative for abdominal pain and heartburn.       Denies reflux  Genitourinary: Negative for dysuria.  Skin: Negative for itching and rash.  Neurological: Negative for headaches.    Physical Exam: BP 118/78   Pulse 68   Temp 98.1 F (36.7 C) (Oral)   Resp 16   Ht 5\' 8"  (1.727 m)   Wt 180 lb (81.6 kg)   LMP 12/03/2011   SpO2 98%   BMI 27.37 kg/m    Physical Exam Constitutional:      Appearance: Normal appearance.  HENT:     Head: Atraumatic.     Comments: Pharynx normal. Eyes normal. Ears- unable to  visualize bilateral tympanic membranes due to cerumen. Nose normal.    Right Ear: Ear canal and external ear normal.     Left Ear: Ear canal and external ear normal.     Nose: Nose normal.     Mouth/Throat:     Mouth: Mucous membranes are moist.     Pharynx: Oropharynx is clear.  Eyes:     Conjunctiva/sclera: Conjunctivae normal.  Cardiovascular:     Rate and Rhythm: Regular rhythm.     Heart sounds: Normal heart sounds.  Pulmonary:     Effort: Pulmonary effort is normal.     Breath sounds: Normal breath sounds.     Comments: Lungs clear to auscultation Musculoskeletal:     Cervical back: Neck supple.  Skin:    General: Skin is warm.  Neurological:     Mental Status: She is alert and oriented to person, place, and time.  Psychiatric:        Mood and Affect: Mood normal.        Behavior: Behavior normal.        Thought Content: Thought content normal.        Judgment: Judgment normal.     Diagnostics:  None  Assessment and Plan: 1. Anaphylaxis due to hymenoptera venom, accidental or  unintentional, subsequent encounter   2. Hoarseness     No orders of the defined types were placed in this encounter.   Patient Instructions  Hymenoptra reaction Avoid stinging insects. In case of an allergic reaction, give Benadryl 4 teaspoonfuls every 4 hours, and if life-threatening symptoms occur, inject with AuviQ 0.3 mg.  Hoarseness Continue to follow with ENT as needed  Please let us know if this treatment plan is not working well for you Schedule follow up appointment in 1 year   Return in about 1 year (around 09/07/2021), or if symptoms worsen or fail to improve.    Thank you for the opportunity to care for this patient.  Please do not hesitate to contact me with questions.  Nehemiah Settle, FNP [facility]

## 2020-11-04 DIAGNOSIS — I10 Essential (primary) hypertension: Secondary | ICD-10-CM | POA: Diagnosis not present

## 2020-11-04 DIAGNOSIS — E785 Hyperlipidemia, unspecified: Secondary | ICD-10-CM | POA: Diagnosis not present

## 2020-11-04 DIAGNOSIS — E1169 Type 2 diabetes mellitus with other specified complication: Secondary | ICD-10-CM | POA: Diagnosis not present

## 2020-11-06 DIAGNOSIS — E785 Hyperlipidemia, unspecified: Secondary | ICD-10-CM | POA: Diagnosis not present

## 2020-11-06 DIAGNOSIS — E1169 Type 2 diabetes mellitus with other specified complication: Secondary | ICD-10-CM | POA: Diagnosis not present

## 2020-11-06 DIAGNOSIS — I10 Essential (primary) hypertension: Secondary | ICD-10-CM | POA: Diagnosis not present

## 2020-11-24 ENCOUNTER — Other Ambulatory Visit: Payer: Self-pay | Admitting: Obstetrics and Gynecology

## 2020-11-24 DIAGNOSIS — Z1231 Encounter for screening mammogram for malignant neoplasm of breast: Secondary | ICD-10-CM

## 2020-12-22 DIAGNOSIS — N952 Postmenopausal atrophic vaginitis: Secondary | ICD-10-CM | POA: Diagnosis not present

## 2020-12-22 DIAGNOSIS — Z6825 Body mass index (BMI) 25.0-25.9, adult: Secondary | ICD-10-CM | POA: Diagnosis not present

## 2020-12-22 DIAGNOSIS — Z01419 Encounter for gynecological examination (general) (routine) without abnormal findings: Secondary | ICD-10-CM | POA: Diagnosis not present

## 2020-12-31 ENCOUNTER — Ambulatory Visit: Payer: BC Managed Care – PPO

## 2021-01-04 ENCOUNTER — Other Ambulatory Visit: Payer: Self-pay

## 2021-01-04 ENCOUNTER — Ambulatory Visit
Admission: RE | Admit: 2021-01-04 | Discharge: 2021-01-04 | Disposition: A | Discharge status: Home / Self Care | Payer: BC Managed Care – PPO | Source: Ambulatory Visit | Attending: Obstetrics and Gynecology | Admitting: Obstetrics and Gynecology

## 2021-01-04 DIAGNOSIS — Z1231 Encounter for screening mammogram for malignant neoplasm of breast: Secondary | ICD-10-CM

## 2021-01-21 DIAGNOSIS — E785 Hyperlipidemia, unspecified: Secondary | ICD-10-CM | POA: Diagnosis not present

## 2021-01-21 DIAGNOSIS — K2100 Gastro-esophageal reflux disease with esophagitis, without bleeding: Secondary | ICD-10-CM | POA: Diagnosis not present

## 2021-01-21 DIAGNOSIS — R7309 Other abnormal glucose: Secondary | ICD-10-CM | POA: Diagnosis not present

## 2021-01-21 DIAGNOSIS — F064 Anxiety disorder due to known physiological condition: Secondary | ICD-10-CM | POA: Diagnosis not present

## 2021-01-21 DIAGNOSIS — I10 Essential (primary) hypertension: Secondary | ICD-10-CM | POA: Diagnosis not present

## 2021-01-21 DIAGNOSIS — E1169 Type 2 diabetes mellitus with other specified complication: Secondary | ICD-10-CM | POA: Diagnosis not present

## 2021-02-12 DIAGNOSIS — R739 Hyperglycemia, unspecified: Secondary | ICD-10-CM | POA: Diagnosis not present

## 2021-02-12 DIAGNOSIS — I10 Essential (primary) hypertension: Secondary | ICD-10-CM | POA: Diagnosis not present

## 2021-02-12 DIAGNOSIS — E1169 Type 2 diabetes mellitus with other specified complication: Secondary | ICD-10-CM | POA: Diagnosis not present

## 2021-02-12 DIAGNOSIS — K2100 Gastro-esophageal reflux disease with esophagitis, without bleeding: Secondary | ICD-10-CM | POA: Diagnosis not present

## 2021-02-15 ENCOUNTER — Ambulatory Visit: Payer: BC Managed Care – PPO

## 2021-02-18 DIAGNOSIS — K219 Gastro-esophageal reflux disease without esophagitis: Secondary | ICD-10-CM | POA: Diagnosis not present

## 2021-02-18 DIAGNOSIS — Z86010 Personal history of colonic polyps: Secondary | ICD-10-CM | POA: Diagnosis not present

## 2021-02-18 DIAGNOSIS — K5730 Diverticulosis of large intestine without perforation or abscess without bleeding: Secondary | ICD-10-CM | POA: Diagnosis not present

## 2021-03-11 DIAGNOSIS — E1169 Type 2 diabetes mellitus with other specified complication: Secondary | ICD-10-CM | POA: Diagnosis not present

## 2021-03-11 DIAGNOSIS — E785 Hyperlipidemia, unspecified: Secondary | ICD-10-CM | POA: Diagnosis not present

## 2021-03-11 DIAGNOSIS — F064 Anxiety disorder due to known physiological condition: Secondary | ICD-10-CM | POA: Diagnosis not present

## 2021-03-11 DIAGNOSIS — I10 Essential (primary) hypertension: Secondary | ICD-10-CM | POA: Diagnosis not present

## 2021-03-12 DIAGNOSIS — E1169 Type 2 diabetes mellitus with other specified complication: Secondary | ICD-10-CM | POA: Diagnosis not present

## 2021-03-12 DIAGNOSIS — M940 Chondrocostal junction syndrome [Tietze]: Secondary | ICD-10-CM | POA: Diagnosis not present

## 2021-03-12 DIAGNOSIS — F064 Anxiety disorder due to known physiological condition: Secondary | ICD-10-CM | POA: Diagnosis not present

## 2021-03-12 DIAGNOSIS — I10 Essential (primary) hypertension: Secondary | ICD-10-CM | POA: Diagnosis not present

## 2021-03-18 NOTE — Progress Notes (Signed)
Patient was supposed to see Dr. Rosemary Holms but was seen my me last week. Please see the last OV note.   STS

## 2021-03-19 ENCOUNTER — Encounter: Payer: Self-pay | Admitting: Cardiology

## 2021-03-19 ENCOUNTER — Ambulatory Visit: Payer: BC Managed Care – PPO | Admitting: Cardiology

## 2021-03-19 ENCOUNTER — Other Ambulatory Visit: Payer: Self-pay

## 2021-03-19 VITALS — BP 148/99 | HR 88 | Temp 97.7°F | Ht 69.0 in | Wt 171.0 lb

## 2021-03-19 VITALS — BP 148/99 | HR 88 | Temp 97.0°F | Resp 10 | Ht 69.0 in | Wt 171.0 lb

## 2021-03-19 DIAGNOSIS — I10 Essential (primary) hypertension: Secondary | ICD-10-CM | POA: Diagnosis not present

## 2021-03-19 DIAGNOSIS — R072 Precordial pain: Secondary | ICD-10-CM | POA: Diagnosis not present

## 2021-03-19 DIAGNOSIS — E782 Mixed hyperlipidemia: Secondary | ICD-10-CM

## 2021-03-19 DIAGNOSIS — E119 Type 2 diabetes mellitus without complications: Secondary | ICD-10-CM | POA: Diagnosis not present

## 2021-03-19 MED ORDER — ASPIRIN EC 81 MG PO TBEC: 81.0000 mg | DELAYED_RELEASE_TABLET | Freq: Every day | ORAL | 0 refills | Status: AC

## 2021-03-19 MED ORDER — NITROGLYCERIN 0.4 MG SL SUBL: 0.4000 mg | SUBLINGUAL_TABLET | SUBLINGUAL | 0 refills | Status: DC | PRN

## 2021-03-19 MED ORDER — METOPROLOL TARTRATE 25 MG PO TABS: 25.0000 mg | ORAL_TABLET | Freq: Two times a day (BID) | ORAL | 0 refills | Status: AC

## 2021-03-19 NOTE — Progress Notes (Signed)
Date:  03/19/2021   ID:  Amber Barber, DOB Mar 19, 1961, MRN [id number]  PCP:  Lucianne Lei, MD  Cardiologist:  Rex Kras, DO, [facility] (established care 03/19/2021)  REASON FOR CONSULT: Chest Discomfort  REQUESTING PHYSICIAN:  Lucianne Lei, [place] [place],  [place] [address]  Chief Complaint  Patient presents with  . Chest Pain    HPI  Amber Barber is a 60 y.o. African-American female who presents to the office with a chief complaint of " chest pain." Patient's past medical history and cardiovascular risk factors include: Benign essential hypertension, hyperlipidemia, diabetes mellitus type 2 non-insulin-dependent, postmenopausal female.  She is referred to the office at the request of Lucianne Lei, MD for evaluation of chest pain.  Chest pain: Patient states that his symptoms started in February 2022.  Initially thought it was heartburn and took over-the-counter medications but no significant relief.  She also followed up with gastroenterology and was prescribed Dexilant and Carafate which helped some of her symptoms but no complete resolution.  She followed up with her primary care provider and now is referred to cardiology for further evaluation and management.  Patient states that she has a burning-like/pressure-like sensation substernally, intensity can be as high as 7 out of 10, lasting for a few minutes, and self-limiting.  The pain is not brought on by effort related activities and does not resolve with resting.  However she states that the intensity, frequency, and duration of her symptoms have progressed since February 2022.  Patient states that the symptoms usually get better with drinking cold water.  Pain is localizable and reproducible with palpation.  She has not noticed any change in her physical endurance since February 2022.  No family history of premature coronary disease or sudden cardiac death.  FUNCTIONAL STATUS: Walks 3-4 times a week for  approximately 35 minutes.  ALLERGIES: Allergies  Allergen Reactions  . Bee Venom Anaphylaxis  . Pollen Extract     Seasonal allergies   . Penicillins Rash    Swelling (not throat swelling), swelling in other parts of body    MEDICATION LIST PRIOR TO VISIT: Current Meds  Medication Sig  . aspirin EC 81 MG tablet Take 1 tablet (81 mg total) by mouth daily. Swallow whole.  . metoprolol tartrate (LOPRESSOR) 25 MG tablet Take 1 tablet (25 mg total) by mouth 2 (two) times daily.  . nitroGLYCERIN (NITROSTAT) 0.4 MG SL tablet Place 1 tablet (0.4 mg total) under the tongue every 5 (five) minutes as needed for chest pain. If you require more than two tablets five minutes apart go to the nearest ER via EMS.     PAST MEDICAL HISTORY: Past Medical History:  Diagnosis Date  . Diabetes mellitus without complication ([place])   . Hyperlipidemia   . Hypertension   . LSIL (low grade squamous intraepithelial lesion) on Pap smear 2010    PAST SURGICAL HISTORY: Past Surgical History:  Procedure Laterality Date  . BREAST BIOPSY Bilateral 2010,2012   benign  . BREAST SURGERY     core biopsy- L breast   . COLONOSCOPY     long time  . COLPOSCOPY  2010  . ECTOPIC PREGNANCY SURGERY     x2  . HAMMER TOE SURGERY     left little toe  . SHOULDER ARTHROSCOPY Right 09/19/2013   Procedure: RIGHT ARTHROSCOPY SHOULDER WITH ACROMIOPLASTY ;  Surgeon: Sharmon Revere, MD;  Location: [facility];  Service: Orthopedics;  Laterality: Right;  .  TUBAL LIGATION      FAMILY HISTORY: The patient family history includes Breast cancer in her maternal aunt; Diabetes in her father and maternal grandmother; Heart disease in her sister; Hypertension in her father, maternal grandmother, and mother; Thyroid disease in her sister.  SOCIAL HISTORY:  The patient  reports that she has never smoked. She has never used smokeless tobacco. She reports current alcohol use. She reports that she does not use drugs.  REVIEW OF  SYSTEMS: Review of Systems  Constitutional: Negative for chills and fever.  HENT: Negative for hoarse voice and nosebleeds.   Eyes: Negative for discharge, double vision and pain.  Cardiovascular: Positive for chest pain. Negative for claudication, dyspnea on exertion, leg swelling, near-syncope, orthopnea, palpitations, paroxysmal nocturnal dyspnea and syncope.  Respiratory: Negative for hemoptysis and shortness of breath.   Musculoskeletal: Negative for muscle cramps and myalgias.  Gastrointestinal: Negative for abdominal pain, constipation, diarrhea, hematemesis, hematochezia, melena, nausea and vomiting.  Neurological: Negative for dizziness and light-headedness.    PHYSICAL EXAM: Vitals with BMI 03/19/2021 03/19/2021 03/19/2021  Height _0  - _1   Weight 171 lbs - 171 lbs  BMI 29.52 - 84.13  Systolic 244 010 272  Diastolic 99 99 91  Pulse 88 88 93    CONSTITUTIONAL: Well-developed and well-nourished. No acute distress.  SKIN: Skin is warm and dry. No rash noted. No cyanosis. No pallor. No jaundice HEAD: Normocephalic and atraumatic.  EYES: No scleral icterus MOUTH/THROAT: Moist oral membranes.  NECK: No JVD present. No thyromegaly noted. No carotid bruits  LYMPHATIC: No visible cervical adenopathy.  CHEST Normal respiratory effort. No intercostal retractions  LUNGS: Clear to auscultation bilaterally no stridor. No wheezes. No rales.  CARDIOVASCULAR: Regular rate and rhythm, positive S1-S2, no murmurs rubs or gallops appreciated. ABDOMINAL: No apparent ascites.  EXTREMITIES: No peripheral edema  HEMATOLOGIC: No significant bruising NEUROLOGIC: Oriented to person, place, and time. Nonfocal. Normal muscle tone.  PSYCHIATRIC: Normal mood and affect. Normal behavior. Cooperative  CARDIAC DATABASE: EKG: 03/19/2021: Normal sinus rhythm, 83 bpm, normal axis, without underlying ischemia or injury pattern.  Echocardiogram: No results found for this or any previous visit from the  past 1095 days.  Stress Testing: No results found for this or any previous visit from the past 1095 days.  Heart Catheterization: None   LABORATORY DATA: CBC Latest Ref Rng & Units 09/11/2013  WBC 4.0 - 10.5 K/uL 4.3  Hemoglobin 12.0 - 15.0 g/dL 12.8  Hematocrit 36.0 - 46.0 % 40.0  Platelets 150 - 400 K/uL 170    CMP Latest Ref Rng & Units 09/11/2013  Glucose 70 - 99 mg/dL 91  BUN 6 - 23 mg/dL 16  Creatinine 0.50 - 1.10 mg/dL 0.86  Sodium 135 - 145 mEq/L 137  Potassium 3.5 - 5.1 mEq/L 3.8  Chloride 96 - 112 mEq/L 99  CO2 19 - 32 mEq/L 29  Calcium 8.4 - 10.5 mg/dL 10.1  Total Protein 6.0 - 8.3 g/dL 8.1  Total Bilirubin 0.3 - 1.2 mg/dL 0.5  Alkaline Phos 39 - 117 U/L 71  AST 0 - 37 U/L 31  ALT 0 - 35 U/L 37(H)    Lipid Panel  No results found for: CHOL, TRIG, HDL, CHOLHDL, VLDL, LDLCALC, LDLDIRECT, LABVLDL  No components found for: NTPROBNP No results for input(s): PROBNP in the last 8760 hours. No results for input(s): TSH in the last 8760 hours.  BMP No results for input(s): NA, K, CL, CO2, GLUCOSE, BUN, CREATININE, CALCIUM, GFRNONAA, GFRAA in  the last 8760 hours.  HEMOGLOBIN A1C No results found for: HGBA1C, MPG   External labs: Collected 03/11/2021. Total cholesterol 152, HDL 64, triglycerides 52, LDL 75, non-HDL 88. Creatinine 0.85 eGFR 87 Sodium 138, potassium 3.8, chloride 103, bicarb 27 AST 18, ALT 17, alkaline phosphatase 38 Hemoglobin 11.6, hematocrit 36.9 Hemoglobin A1c 5.9  IMPRESSION:    ICD-10-CM   1. Precordial pain  R07.2 metoprolol tartrate (LOPRESSOR) 25 MG tablet    nitroGLYCERIN (NITROSTAT) 0.4 MG SL tablet    aspirin EC 81 MG tablet    PCV ECHOCARDIOGRAM COMPLETE    CT CORONARY MORPH W/CTA COR W/SCORE W/CA W/CM &/OR WO/CM    CANCELED: CT CORONARY MORPH W/CTA COR W/SCORE W/CA W/CM &/OR WO/CM    CANCELED: CT CORONARY FRACTIONAL FLOW RESERVE DATA PREP    CANCELED: CT CORONARY FRACTIONAL FLOW RESERVE FLUID ANALYSIS  2. Benign hypertension   I10   3. Mixed hyperlipidemia  E78.2   4. Non-insulin dependent type 2 diabetes mellitus ([place])  E11.9      RECOMMENDATIONS: Amber Barber is a 60 y.o. female whose past medical history and cardiac risk factors include: Benign essential hypertension, hyperlipidemia, diabetes mellitus type 2 non-insulin-dependent, postmenopausal female.  Precordial pain: Patient's symptoms are suggestive of possible cardiac etiology and has multiple cardiovascular risk factors as noted above. Shared decision was to proceed with ischemic evaluation. Will schedule coronary CTA with possible CT FFR. Start Lopressor 25 mg p.o. twice daily Prescribe sublingual nitroglycerin tablets to use on a as needed basis, medication profile discussed. Start aspirin 81 mg p.o. daily until the work-up is complete. Educated on seeking medical attention sooner by going to the closest ER via EMS if the symptoms increase in intensity, frequency, duration, or has typical chest pain as discussed in the office.  Patient verbalized understanding.  Non-insulin-dependent diabetes mellitus type 2:  Educated on importance of glycemic control. Currently managed by primary care provider.  Mixed hyperlipidemia: Continue statin therapy. Most recent lipid profile independently reviewed. Goal LDL less than 70 mg/dL. Does not endorse any myalgias.  FINAL MEDICATION LIST END OF ENCOUNTER: Meds ordered this encounter  Medications  . metoprolol tartrate (LOPRESSOR) 25 MG tablet    Sig: Take 1 tablet (25 mg total) by mouth 2 (two) times daily.    Dispense:  180 tablet    Refill:  0  . nitroGLYCERIN (NITROSTAT) 0.4 MG SL tablet    Sig: Place 1 tablet (0.4 mg total) under the tongue every 5 (five) minutes as needed for chest pain. If you require more than two tablets five minutes apart go to the nearest ER via EMS.    Dispense:  30 tablet    Refill:  0  . aspirin EC 81 MG tablet    Sig: Take 1 tablet (81 mg total) by mouth daily. Swallow  whole.    Dispense:  30 tablet    Refill:  0    There are no discontinued medications.   Current Outpatient Medications:  .  aspirin EC 81 MG tablet, Take 1 tablet (81 mg total) by mouth daily. Swallow whole., Disp: 30 tablet, Rfl: 0 .  metoprolol tartrate (LOPRESSOR) 25 MG tablet, Take 1 tablet (25 mg total) by mouth 2 (two) times daily., Disp: 180 tablet, Rfl: 0 .  nitroGLYCERIN (NITROSTAT) 0.4 MG SL tablet, Place 1 tablet (0.4 mg total) under the tongue every 5 (five) minutes as needed for chest pain. If you require more than two tablets five minutes apart go to the  nearest ER via EMS., Disp: 30 tablet, Rfl: 0 .  Cholecalciferol (D3) 50 MCG (2000 UT) TABS, Take 1 tablet by mouth daily., Disp: , Rfl:  .  dexlansoprazole (DEXILANT) 60 MG capsule, Take 60 mg by mouth daily., Disp: , Rfl:  .  EPINEPHrine 0.3 mg/0.3 mL IJ SOAJ injection, Auvi-Q 0.3 mg/0.3 mL injection, auto-injector, Disp: 2 each, Rfl: 1 .  metFORMIN (GLUCOPHAGE) 500 MG tablet, Take by mouth 2 (two) times daily with a meal., Disp: , Rfl:  .  rosuvastatin (CRESTOR) 10 MG tablet, Crestor 10 mg tablet, Disp: , Rfl:  .  sucralfate (CARAFATE) 1 g tablet, Take 1 g by mouth 4 (four) times daily., Disp: , Rfl:  .  valsartan-hydrochlorothiazide (DIOVAN-HCT) 80-12.5 MG tablet, Take 1 tablet by mouth daily., Disp: , Rfl:   Orders Placed This Encounter  Procedures  . CT CORONARY MORPH W/CTA COR W/SCORE W/CA W/CM &/OR WO/CM  . PCV ECHOCARDIOGRAM COMPLETE    There are no Patient Instructions on file for this visit.   --Continue cardiac medications as reconciled in final medication list. --Return in about 2 weeks (around 04/02/2021) for Follow up, Chest pain. Or sooner if needed. --Continue follow-up with your primary care physician regarding the management of your other chronic comorbid conditions.  Patient's questions and concerns were addressed to her satisfaction. She voices understanding of the instructions provided during this  encounter.   This note was created using a voice recognition software as a result there may be grammatical errors inadvertently enclosed that do not reflect the nature of this encounter. Every attempt is made to correct such errors.  Rex Kras, [place], [facility]  Pager: [phone] Office: [phone]

## 2021-03-22 ENCOUNTER — Emergency Department (HOSPITAL_COMMUNITY)
Admission: EM | Admit: 2021-03-22 | Discharge: 2021-03-23 | Disposition: A | Discharge status: Home / Self Care | Payer: BC Managed Care – PPO | Attending: Emergency Medicine | Admitting: Emergency Medicine

## 2021-03-22 ENCOUNTER — Encounter (HOSPITAL_COMMUNITY): Payer: Self-pay

## 2021-03-22 ENCOUNTER — Other Ambulatory Visit: Payer: Self-pay

## 2021-03-22 ENCOUNTER — Emergency Department (HOSPITAL_COMMUNITY): Payer: BC Managed Care – PPO

## 2021-03-22 DIAGNOSIS — Z7982 Long term (current) use of aspirin: Secondary | ICD-10-CM | POA: Diagnosis not present

## 2021-03-22 DIAGNOSIS — E119 Type 2 diabetes mellitus without complications: Secondary | ICD-10-CM | POA: Insufficient documentation

## 2021-03-22 DIAGNOSIS — I10 Essential (primary) hypertension: Secondary | ICD-10-CM | POA: Diagnosis not present

## 2021-03-22 DIAGNOSIS — Z79899 Other long term (current) drug therapy: Secondary | ICD-10-CM | POA: Diagnosis not present

## 2021-03-22 DIAGNOSIS — R1013 Epigastric pain: Secondary | ICD-10-CM | POA: Insufficient documentation

## 2021-03-22 DIAGNOSIS — R0789 Other chest pain: Secondary | ICD-10-CM | POA: Diagnosis not present

## 2021-03-22 DIAGNOSIS — R0609 Other forms of dyspnea: Secondary | ICD-10-CM | POA: Insufficient documentation

## 2021-03-22 DIAGNOSIS — Z7984 Long term (current) use of oral hypoglycemic drugs: Secondary | ICD-10-CM | POA: Insufficient documentation

## 2021-03-22 DIAGNOSIS — R079 Chest pain, unspecified: Secondary | ICD-10-CM | POA: Diagnosis not present

## 2021-03-22 LAB — BASIC METABOLIC PANEL
Anion gap: 8 (ref 5–15)
BUN: 24 mg/dL — ABNORMAL HIGH (ref 6–20)
CO2: 27 mmol/L (ref 22–32)
Calcium: 10.1 mg/dL (ref 8.9–10.3)
Chloride: 102 mmol/L (ref 98–111)
Creatinine, Ser: 1.35 mg/dL — ABNORMAL HIGH (ref 0.44–1.00)
GFR, Estimated: 45 mL/min — ABNORMAL LOW (ref >60)
Glucose, Bld: 112 mg/dL — ABNORMAL HIGH (ref 70–99)
Potassium: 3.6 mmol/L (ref 3.5–5.1)
Sodium: 137 mmol/L (ref 135–145)

## 2021-03-22 LAB — CBC
HCT: 40.7 % (ref 36.0–46.0)
Hemoglobin: 13 g/dL (ref 12.0–15.0)
MCH: 28.2 pg (ref 26.0–34.0)
MCHC: 31.9 g/dL (ref 30.0–36.0)
MCV: 88.3 fL (ref 80.0–100.0)
Platelets: 183 10*3/uL (ref 150–400)
RBC: 4.61 MIL/uL (ref 3.87–5.11)
RDW: 12.7 % (ref 11.5–15.5)
WBC: 5.1 10*3/uL (ref 4.0–10.5)
nRBC: 0 % (ref 0.0–0.2)

## 2021-03-22 LAB — TROPONIN I (HIGH SENSITIVITY): Troponin I (High Sensitivity): 2 ng/L (ref <18)

## 2021-03-22 NOTE — ED Triage Notes (Signed)
Emergency Medicine Provider Triage Evaluation Note  Amber Barber , a 60 y.o. female  was evaluated in triage.  Pt complains of burning chest pain, also stomach and throat pain. No changes in pain pattern. Pain is worse with movement. Onset February 2022.  Seen by PCP thought to be GERD, taking medication, seen by GI as well and given more meds without relief. Seen by cards Friday, possible heart disease, scheduled for CT next Tuesday/wednesday, given Nitro which she hasn't taken.  Review of Systems  Positive: CP, abdominal pain, sore throat.  Negative: SHOB  Physical Exam  BP 114/74 (BP Location: Left Arm)   Pulse 88   Temp 98.2 F (36.8 C) (Oral)   Resp 16   Ht 5\' 9"  (1.753 m)   Wt 77.6 kg   LMP 12/03/2011   SpO2 100%   BMI 25.25 kg/m  Gen:   Awake, no distress   HEENT:  Atraumatic  Resp:  Normal effort  Cardiac:  Normal rate  Abd:   Nondistended, nontender  MSK:   Moves extremities without difficulty  Neuro:  Speech clear   Medical Decision Making  Medically screening exam initiated at 8:17 PM.  Appropriate orders placed.  Amber Barber was informed that the remainder of the evaluation will be completed by another provider, this initial triage assessment does not replace that evaluation, and the importance of remaining in the ED until their evaluation is complete.  Clinical Impression     Amber Barber 03/22/21 2020

## 2021-03-22 NOTE — ED Triage Notes (Signed)
Pt c/o epigastric pain since mid February, has been seen by PCP and cardiology and tried on multiple different medications without significant relief

## 2021-03-23 LAB — TROPONIN I (HIGH SENSITIVITY): Troponin I (High Sensitivity): 2 ng/L (ref <18)

## 2021-03-23 MED ORDER — ALUM & MAG HYDROXIDE-SIMETH 200-200-20 MG/5ML PO SUSP
15.0000 mL | Freq: Once | ORAL | Status: AC
  Administered 2021-03-23: 15 mL via ORAL
  Filled 2021-03-23: qty 30

## 2021-03-23 MED ORDER — SUCRALFATE 1 GM/10ML PO SUSP: 1.0000 g | Freq: Three times a day (TID) | ORAL | 0 refills | Status: DC

## 2021-03-23 NOTE — ED Provider Notes (Signed)
[facility]-EMERGENCY DEPT Provider Note   CSN: [id number] Arrival date & time: 03/22/21  1951     History Chief Complaint  Patient presents with  . Abdominal Pain    Amber Barber is a 60 y.o. female.  The history is provided by the patient and medical records.  Abdominal Pain  Amber Barber is a 60 y.o. female who presents to the Emergency Department complaining of chest pain.  She developed burning in her chest and throat in February.  She saw her PCP and GI and was treated for reflux.  She has associated chest pressure.  She has also been seen by Cardiology (last week).  She is scheduled for a CT scan of her chest next week.  Today she has worsening pain and also has some DOE, which is new.   No fever, cough, V/D.  Has mild epigastric discomfort (present since February).  No leg swelling.    No hx/o DVT/PE.  Does not take hormones.  Nonsmoker.      Past Medical History:  Diagnosis Date  . Diabetes mellitus without complication (HCC)   . Hyperlipidemia   . Hypertension   . LSIL (low grade squamous intraepithelial lesion) on Pap smear 2010    There are no problems to display for this patient.   Past Surgical History:  Procedure Laterality Date  . BREAST BIOPSY Bilateral 2010,2012   benign  . BREAST SURGERY     core biopsy- L breast   . COLONOSCOPY     long time  . COLPOSCOPY  2010  . ECTOPIC PREGNANCY SURGERY     x2  . HAMMER TOE SURGERY     left little toe  . SHOULDER ARTHROSCOPY Right 09/19/2013   Procedure: RIGHT ARTHROSCOPY SHOULDER WITH ACROMIOPLASTY ;  Surgeon: Kennieth Rad, MD;  Location: [facility] OR;  Service: Orthopedics;  Laterality: Right;  . TUBAL LIGATION       OB History    Gravida  3   Para      Term      Preterm      AB      Living  1     SAB      IAB      Ectopic      Multiple      Live Births              Family History  Problem Relation Age of Onset  . Hypertension Mother   . Hypertension  Father   . Diabetes Father   . Heart disease Sister        congestive heart failure  . Thyroid disease Sister   . Hypertension Maternal Grandmother   . Diabetes Maternal Grandmother   . Breast cancer Maternal Aunt     Social History   Tobacco Use  . Smoking status: Never Smoker  . Smokeless tobacco: Never Used  Substance Use Topics  . Alcohol use: Yes    Comment: social- "every now & ten"   . Drug use: No    Home Medications Prior to Admission medications   Medication Sig Start Date End Date Taking? Authorizing Provider  sucralfate (CARAFATE) 1 GM/10ML suspension Take 10 mLs (1 g total) by mouth 4 (four) times daily -  with meals and at bedtime. 03/23/21  Yes Tilden Fossa, MD  aspirin EC 81 MG tablet Take 1 tablet (81 mg total) by mouth daily. Swallow whole. 03/19/21 04/18/21  Tessa Lerner, DO  Cholecalciferol (D3) 50  MCG (2000 UT) TABS Take 1 tablet by mouth daily.    [provider]  dexlansoprazole (DEXILANT) 60 MG capsule Take 60 mg by mouth daily.    [provider]  EPINEPHrine 0.3 mg/0.3 mL IJ SOAJ injection Auvi-Q 0.3 mg/0.3 mL injection, auto-injector 09/07/20   Nehemiah Settle, FNP  metFORMIN (GLUCOPHAGE) 500 MG tablet Take by mouth 2 (two) times daily with a meal.    [provider]  metoprolol tartrate (LOPRESSOR) 25 MG tablet Take 1 tablet (25 mg total) by mouth 2 (two) times daily. 03/19/21 06/17/21  Tolia, Sunit, DO  nitroGLYCERIN (NITROSTAT) 0.4 MG SL tablet Place 1 tablet (0.4 mg total) under the tongue every 5 (five) minutes as needed for chest pain. If you require more than two tablets five minutes apart go to the nearest ER via EMS. 03/19/21 04/18/21  Tolia, Sunit, DO  rosuvastatin (CRESTOR) 10 MG tablet Crestor 10 mg tablet    [provider]  valsartan-hydrochlorothiazide (DIOVAN-HCT) 80-12.5 MG tablet Take 1 tablet by mouth daily. 05/21/20   [provider]    Allergies    Bee venom, Pollen extract, and  Penicillins  Review of Systems   Review of Systems  Gastrointestinal: Positive for abdominal pain.  All other systems reviewed and are negative.   Physical Exam Updated Vital Signs BP 106/74   Pulse 62   Temp 98 F (36.7 C) (Oral)   Resp 17   Ht 5\' 9"  (1.753 m)   Wt 77.6 kg   LMP 12/03/2011   SpO2 100%   BMI 25.25 kg/m   Physical Exam Vitals and nursing note reviewed.  Constitutional:      Appearance: She is well-developed.  HENT:     Head: Normocephalic and atraumatic.  Cardiovascular:     Rate and Rhythm: Normal rate and regular rhythm.     Heart sounds: No murmur heard.   Pulmonary:     Effort: Pulmonary effort is normal. No respiratory distress.     Breath sounds: Normal breath sounds.  Abdominal:     Palpations: Abdomen is soft.     Tenderness: There is no abdominal tenderness. There is no guarding or rebound.  Musculoskeletal:        General: No swelling or tenderness.  Skin:    General: Skin is warm and dry.  Neurological:     Mental Status: She is alert and oriented to person, place, and time.  Psychiatric:        Behavior: Behavior normal.     ED Results / Procedures / Treatments   Labs (all labs ordered are listed, but only abnormal results are displayed) Labs Reviewed  BASIC METABOLIC PANEL - Abnormal; Notable for the following components:      Result Value   Glucose, Bld 112 (*)    BUN 24 (*)    Creatinine, Ser 1.35 (*)    GFR, Estimated 45 (*)    All other components within normal limits  CBC  TROPONIN I (HIGH SENSITIVITY)  TROPONIN I (HIGH SENSITIVITY)    EKG EKG Interpretation  Date/Time:  Monday Mar 22 2021 19:57:01 EDT Ventricular Rate:  88 PR Interval:  186 QRS Duration: 91 QT Interval:  358 QTC Calculation: 434 R Axis:   55 Text Interpretation: Sinus rhythm Baseline wander 12 Lead; Mason-Likar Confirmed by 09-22-1997 [id number]) on 03/23/2021 2:41:40 AM   Radiology DG Chest Port 1 View  Result Date: 03/22/2021 CLINICAL  DATA:  Chest pain EXAM: PORTABLE CHEST 1 VIEW COMPARISON:  09/11/2013 FINDINGS: The heart size and mediastinal contours are within normal limits. Both lungs are clear. The visualized skeletal structures are unremarkable. IMPRESSION: No active disease. Electronically Signed   By: Deatra Robinson M.D.   On: 03/22/2021 20:40    Procedures Procedures   Medications Ordered in ED Medications  alum & mag hydroxide-simeth (MAALOX/MYLANTA) 200-200-20 MG/5ML suspension 15 mL (15 mLs Oral Given 03/23/21 0247)    ED Course  I have reviewed the triage vital signs and the nursing notes.  Pertinent labs & imaging results that were available during my care of the patient were reviewed by me and considered in my medical decision making (see chart for details).    MDM Rules/Calculators/A&P                         patient with history of hypertension, diabetes, hyperlipidemia here for evaluation of burning chest discomfort since February of this year. Now she has associated exertional dyspnea as well. EKG without acute ischemic changes. Troponin is negative times two. Presentation is not consistent with PE, ACS, dissection. Symptoms are partially improved after G.I. cocktail. Will change her Carafate tablets to Carafate suspension to see if this helps alleviate her symptoms. Feel that she is stable for outpatient follow-up with both G.I. and cardiology for further evaluation of her ongoing symptoms. Return precautions discussed.  Final Clinical Impression(s) / ED Diagnoses Final diagnoses:  Atypical chest pain    Rx / DC Orders ED Discharge Orders         Ordered    sucralfate (CARAFATE) 1 GM/10ML suspension  3 times daily with meals & bedtime        03/23/21 0347           Tilden Fossa, MD 03/23/21 [phone]

## 2021-03-29 ENCOUNTER — Telehealth (HOSPITAL_COMMUNITY): Payer: Self-pay | Admitting: Emergency Medicine

## 2021-03-29 NOTE — Telephone Encounter (Signed)
Reaching out to patient to offer assistance regarding upcoming cardiac imaging study; pt verbalizes understanding of appt date/time, parking situation and where to check in, pre-test NPO status and medications ordered, and verified current allergies; name and call back number provided for further questions should they arise Rockwell Alexandria RN Navigator Cardiac Imaging [facility] [phone] office [phone] cell  Pt encouraged to drink plenty of water prior to scan, holding valsartan-HCTZ 2 days prior to scan, holding metformin 48h post scan, taking metoprolol 2 hr prior to scan [place]

## 2021-03-30 ENCOUNTER — Other Ambulatory Visit: Payer: Self-pay

## 2021-03-30 ENCOUNTER — Ambulatory Visit: Payer: BC Managed Care – PPO

## 2021-03-30 DIAGNOSIS — R072 Precordial pain: Secondary | ICD-10-CM

## 2021-03-31 ENCOUNTER — Ambulatory Visit (HOSPITAL_COMMUNITY)
Admission: RE | Admit: 2021-03-31 | Discharge: 2021-03-31 | Disposition: A | Discharge status: Home / Self Care | Payer: BC Managed Care – PPO | Source: Ambulatory Visit | Attending: Cardiology | Admitting: Cardiology

## 2021-03-31 DIAGNOSIS — R072 Precordial pain: Secondary | ICD-10-CM | POA: Diagnosis not present

## 2021-03-31 MED ORDER — METOPROLOL TARTRATE 5 MG/5ML IV SOLN
INTRAVENOUS | Status: AC
  Administered 2021-03-31: 10 mg via INTRAVENOUS
  Filled 2021-03-31: qty 20

## 2021-03-31 MED ORDER — NITROGLYCERIN 0.4 MG SL SUBL
SUBLINGUAL_TABLET | SUBLINGUAL | Status: AC
  Administered 2021-03-31: 0.8 mg via SUBLINGUAL
  Filled 2021-03-31: qty 2

## 2021-03-31 MED ORDER — METOPROLOL TARTRATE 5 MG/5ML IV SOLN: 10.0000 mg | INTRAVENOUS | Status: DC | PRN

## 2021-03-31 MED ORDER — NITROGLYCERIN 0.4 MG SL SUBL: 0.8000 mg | SUBLINGUAL_TABLET | Freq: Once | SUBLINGUAL | Status: AC

## 2021-03-31 MED ORDER — IOHEXOL 350 MG/ML SOLN
100.0000 mL | Freq: Once | INTRAVENOUS | Status: AC | PRN
  Administered 2021-03-31: 100 mL via INTRAVENOUS

## 2021-03-31 NOTE — Progress Notes (Signed)
Spoke to patient she voiced understanding

## 2021-04-06 ENCOUNTER — Telehealth: Payer: Self-pay

## 2021-04-06 NOTE — Telephone Encounter (Signed)
Thanks for letting me know!

## 2021-04-06 NOTE — Telephone Encounter (Signed)
Patient called wanting to know the results of her CT she recently got done. Patient was adamant about it therefore I asked Amber Barber to review it she said CT was normal coronary score was 0 and it showed no evidence of blockage that could be causing chest pain. Spoke to patient and she is aware of results.

## 2021-04-08 DIAGNOSIS — Z86010 Personal history of colonic polyps: Secondary | ICD-10-CM | POA: Diagnosis not present

## 2021-04-08 DIAGNOSIS — K5730 Diverticulosis of large intestine without perforation or abscess without bleeding: Secondary | ICD-10-CM | POA: Diagnosis not present

## 2021-04-08 DIAGNOSIS — R079 Chest pain, unspecified: Secondary | ICD-10-CM | POA: Diagnosis not present

## 2021-04-08 DIAGNOSIS — K219 Gastro-esophageal reflux disease without esophagitis: Secondary | ICD-10-CM | POA: Diagnosis not present

## 2021-04-14 DIAGNOSIS — K2970 Gastritis, unspecified, without bleeding: Secondary | ICD-10-CM | POA: Diagnosis not present

## 2021-04-14 DIAGNOSIS — K317 Polyp of stomach and duodenum: Secondary | ICD-10-CM | POA: Diagnosis not present

## 2021-04-14 DIAGNOSIS — R079 Chest pain, unspecified: Secondary | ICD-10-CM | POA: Diagnosis not present

## 2021-04-14 DIAGNOSIS — K219 Gastro-esophageal reflux disease without esophagitis: Secondary | ICD-10-CM | POA: Diagnosis not present

## 2021-04-14 DIAGNOSIS — K319 Disease of stomach and duodenum, unspecified: Secondary | ICD-10-CM | POA: Diagnosis not present

## 2021-04-22 ENCOUNTER — Ambulatory Visit: Payer: BC Managed Care – PPO | Admitting: Family Medicine

## 2021-04-22 DIAGNOSIS — J309 Allergic rhinitis, unspecified: Secondary | ICD-10-CM

## 2021-04-22 NOTE — Progress Notes (Deleted)
   [address] [place] [address] Dept: [phone]  FOLLOW UP NOTE  Patient ID: Amber Barber, female    DOB: 03/08/1961  Age: 60 y.o. MRN: [id number] Date of Office Visit: 04/22/2021  Assessment  Chief Complaint: No chief complaint on file.  HPI Adrian Saran    Drug Allergies:  Allergies  Allergen Reactions  . Bee Venom Anaphylaxis  . Pollen Extract     Seasonal allergies   . Penicillins Rash    Swelling (not throat swelling), swelling in other parts of body    Physical Exam: LMP 12/03/2011    Physical Exam  Diagnostics:    Assessment and Plan: No diagnosis found.  No orders of the defined types were placed in this encounter.   There are no Patient Instructions on file for this visit.  No follow-ups on file.    Thank you for the opportunity to care for this patient.  Please do not hesitate to contact me with questions.  Thermon Leyland, FNP [facility]

## 2021-04-23 DIAGNOSIS — F331 Major depressive disorder, recurrent, moderate: Secondary | ICD-10-CM | POA: Diagnosis not present

## 2021-04-26 DIAGNOSIS — E119 Type 2 diabetes mellitus without complications: Secondary | ICD-10-CM | POA: Diagnosis not present

## 2021-04-27 ENCOUNTER — Encounter: Payer: Self-pay | Admitting: Cardiology

## 2021-04-27 ENCOUNTER — Other Ambulatory Visit: Payer: Self-pay

## 2021-04-27 ENCOUNTER — Ambulatory Visit: Payer: BC Managed Care – PPO | Admitting: Cardiology

## 2021-04-27 VITALS — BP 133/83 | HR 72 | Temp 98.5°F | Resp 16 | Ht 69.0 in | Wt 168.2 lb

## 2021-04-27 DIAGNOSIS — R072 Precordial pain: Secondary | ICD-10-CM

## 2021-04-27 DIAGNOSIS — E119 Type 2 diabetes mellitus without complications: Secondary | ICD-10-CM | POA: Diagnosis not present

## 2021-04-27 DIAGNOSIS — E782 Mixed hyperlipidemia: Secondary | ICD-10-CM | POA: Diagnosis not present

## 2021-04-27 DIAGNOSIS — I10 Essential (primary) hypertension: Secondary | ICD-10-CM | POA: Diagnosis not present

## 2021-04-27 DIAGNOSIS — Z712 Person consulting for explanation of examination or test findings: Secondary | ICD-10-CM

## 2021-04-27 NOTE — Progress Notes (Signed)
Date:  03/19/2021   ID:  Amber Barber, DOB 1961-04-06, MRN [id number]  PCP:  Lucianne Lei, MD  Cardiologist:  Rex Kras, DO, [facility] (established care 03/19/2021)  Date: 04/27/21 Last Office Visit: 03/19/2021  Chief Complaint  Patient presents with  . Follow-up    Reevaluation of chest pain    HPI  Amber Barber is a 60 y.o. African-American female who presents to the office with a chief complaint of " reevaluation of chest pain." Patient's past medical history and cardiovascular risk factors include: Benign essential hypertension, hyperlipidemia, diabetes mellitus type 2 non-insulin-dependent, postmenopausal female.  She is referred to the office at the request of Lucianne Lei, MD for evaluation of chest pain.  Patient was experiencing chest discomfort since February 2022 and tried over-the-counter remedies without any significant improvement.  She has been evaluated by gastroenterology and was placed on Dexilant and Carafate which helped her symptoms but no significant resolution.  She was then referred to cardiology for further evaluation and management in April 2022.  Given her symptoms and multiple cardiovascular risk factors including diabetes as noted above and she was recommended to undergo an ischemic evaluation.  She underwent coronary CTA and an echocardiogram since last office encounter.  Results including images were reviewed with her at today's office visit findings noted below for further reference.  Clinically patient states that his chest pain is resolved.  She has not had any reoccurrence.  No significant change in physical endurance.  No hospitalizations since last office encounter.  No family history of premature coronary disease or sudden cardiac death.  FUNCTIONAL STATUS: Walks 3-4 times a week for approximately 35 minutes.  ALLERGIES: Allergies  Allergen Reactions  . Bee Venom Anaphylaxis  . Pollen Extract     Seasonal allergies   . Penicillins Rash     Swelling (not throat swelling), swelling in other parts of body    MEDICATION LIST PRIOR TO VISIT: Current Meds  Medication Sig  . Cholecalciferol (D3) 50 MCG (2000 UT) TABS Take 1 tablet by mouth daily.  Marland Kitchen EPINEPHrine 0.3 mg/0.3 mL IJ SOAJ injection Auvi-Q 0.3 mg/0.3 mL injection, auto-injector  . metFORMIN (GLUCOPHAGE) 500 MG tablet Take by mouth 2 (two) times daily with a meal.  . metoprolol tartrate (LOPRESSOR) 25 MG tablet Take 1 tablet (25 mg total) by mouth 2 (two) times daily.  . nitroGLYCERIN (NITROSTAT) 0.4 MG SL tablet Place 1 tablet (0.4 mg total) under the tongue every 5 (five) minutes as needed for chest pain. If you require more than two tablets five minutes apart go to the nearest ER via EMS.  . rosuvastatin (CRESTOR) 10 MG tablet Crestor 10 mg tablet  . valsartan-hydrochlorothiazide (DIOVAN-HCT) 80-12.5 MG tablet Take 1 tablet by mouth daily.  . [DISCONTINUED] aspirin EC 81 MG tablet Take 81 mg by mouth daily. Swallow whole.     PAST MEDICAL HISTORY: Past Medical History:  Diagnosis Date  . Diabetes mellitus without complication ([place])   . Hyperlipidemia   . Hypertension   . LSIL (low grade squamous intraepithelial lesion) on Pap smear 2010    PAST SURGICAL HISTORY: Past Surgical History:  Procedure Laterality Date  . BREAST BIOPSY Bilateral 2010,2012   benign  . BREAST SURGERY     core biopsy- L breast   . COLONOSCOPY     long time  . COLPOSCOPY  2010  . ECTOPIC PREGNANCY SURGERY     x2  . HAMMER TOE SURGERY     left little toe  .  SHOULDER ARTHROSCOPY Right 09/19/2013   Procedure: RIGHT ARTHROSCOPY SHOULDER WITH ACROMIOPLASTY ;  Surgeon: Sharmon Revere, MD;  Location: [facility];  Service: Orthopedics;  Laterality: Right;  . TUBAL LIGATION      FAMILY HISTORY: The patient family history includes Breast cancer in her maternal aunt; Diabetes in her father and maternal grandmother; Heart disease in her sister; Hypertension in her father, maternal grandmother,  and mother; Thyroid disease in her sister.  SOCIAL HISTORY:  The patient  reports that she has never smoked. She has never used smokeless tobacco. She reports current alcohol use. She reports that she does not use drugs.  REVIEW OF SYSTEMS: Review of Systems  Constitutional: Negative for chills and fever.  HENT: Negative for hoarse voice and nosebleeds.   Eyes: Negative for discharge, double vision and pain.  Cardiovascular: Negative for chest pain, claudication, dyspnea on exertion, leg swelling, near-syncope, orthopnea, palpitations, paroxysmal nocturnal dyspnea and syncope.  Respiratory: Negative for hemoptysis and shortness of breath.   Musculoskeletal: Negative for muscle cramps and myalgias.  Gastrointestinal: Negative for abdominal pain, constipation, diarrhea, hematemesis, hematochezia, melena, nausea and vomiting.  Neurological: Negative for dizziness and light-headedness.    PHYSICAL EXAM: Vitals with BMI 04/27/2021 03/31/2021 03/31/2021  Height 5' 9"  - -  Weight 168 lbs 3 oz - -  BMI 40.98 - -  Systolic 119 147 829  Diastolic 83 74 84  Pulse 72 68 74    CONSTITUTIONAL: Well-developed and well-nourished. No acute distress.  SKIN: Skin is warm and dry. No rash noted. No cyanosis. No pallor. No jaundice HEAD: Normocephalic and atraumatic.  EYES: No scleral icterus MOUTH/THROAT: Moist oral membranes.  NECK: No JVD present. No thyromegaly noted. No carotid bruits  LYMPHATIC: No visible cervical adenopathy.  CHEST Normal respiratory effort. No intercostal retractions  LUNGS: Clear to auscultation bilaterally no stridor. No wheezes. No rales.  CARDIOVASCULAR: Regular rate and rhythm, positive S1-S2, no murmurs rubs or gallops appreciated. ABDOMINAL: No apparent ascites.  EXTREMITIES: No peripheral edema  HEMATOLOGIC: No significant bruising NEUROLOGIC: Oriented to person, place, and time. Nonfocal. Normal muscle tone.  PSYCHIATRIC: Normal mood and affect. Normal behavior.  Cooperative  CARDIAC DATABASE: EKG: 03/19/2021: Normal sinus rhythm, 83 bpm, normal axis, without underlying ischemia or injury pattern.  Echocardiogram: 03/30/2021: Left ventricle cavity is normal in size and wall thickness. Normal global wall motion. Normal LV systolic function with EF 62%. Normal diastolic filling pattern. No significant valvular abnormality. No evidence of pulmonary hypertension.  Stress Testing: No results found for this or any previous visit from the past 1095 days.  CCTA 03/31/2021: 1. Total coronary calcium score of 0. 2. Normal coronary origin with right dominance. 3. CAD-RADS = 0. 4. No acute or significant extracardiac abnormality.  Heart Catheterization: None   LABORATORY DATA: CBC Latest Ref Rng & Units 03/22/2021 09/11/2013  WBC 4.0 - 10.5 K/uL 5.1 4.3  Hemoglobin 12.0 - 15.0 g/dL 13.0 12.8  Hematocrit 36.0 - 46.0 % 40.7 40.0  Platelets 150 - 400 K/uL 183 170    CMP Latest Ref Rng & Units 03/22/2021 09/11/2013  Glucose 70 - 99 mg/dL 112(H) 91  BUN 6 - 20 mg/dL 24(H) 16  Creatinine 0.44 - 1.00 mg/dL 1.35(H) 0.86  Sodium 135 - 145 mmol/L 137 137  Potassium 3.5 - 5.1 mmol/L 3.6 3.8  Chloride 98 - 111 mmol/L 102 99  CO2 22 - 32 mmol/L 27 29  Calcium 8.9 - 10.3 mg/dL 10.1 10.1  Total Protein 6.0 - 8.3 g/dL -  8.1  Total Bilirubin 0.3 - 1.2 mg/dL - 0.5  Alkaline Phos 39 - 117 U/L - 71  AST 0 - 37 U/L - 31  ALT 0 - 35 U/L - 37(H)    Lipid Panel  No results found for: CHOL, TRIG, HDL, CHOLHDL, VLDL, LDLCALC, LDLDIRECT, LABVLDL  No components found for: NTPROBNP No results for input(s): PROBNP in the last 8760 hours. No results for input(s): TSH in the last 8760 hours.  BMP Recent Labs    03/22/21 2043  NA 137  K 3.6  CL 102  CO2 27  GLUCOSE 112*  BUN 24*  CREATININE 1.35*  CALCIUM 10.1  GFRNONAA 45*    HEMOGLOBIN A1C No results found for: HGBA1C, MPG   External labs: Collected 03/11/2021. Total cholesterol 152, HDL 64,  triglycerides 52, LDL 75, non-HDL 88. Creatinine 0.85 eGFR 87 Sodium 138, potassium 3.8, chloride 103, bicarb 27 AST 18, ALT 17, alkaline phosphatase 38 Hemoglobin 11.6, hematocrit 36.9 Hemoglobin A1c 5.9  IMPRESSION:    ICD-10-CM   1. Precordial pain  R07.2   2. Benign hypertension  I10   3. Mixed hyperlipidemia  E78.2   4. Non-insulin dependent type 2 diabetes mellitus ([place])  E11.9      RECOMMENDATIONS: Amber Barber is a 60 y.o. female whose past medical history and cardiac risk factors include: Benign essential hypertension, hyperlipidemia, diabetes mellitus type 2 non-insulin-dependent, postmenopausal female.  Precordial pain: Resolved.  Reviewed the result of CCTA and echo with the patient at today's visit including the images.  Stop ASA 2m po qday.  No use of sublingual nitroglycerin tablets since last office visit.   No additional diagnostic testing needed at this time.  Hypertension:  Well controlled  Medications reconciled. Patient would like to continue metoprolol since it has helped her blood pressure.  However, recommended that she could transition to Toprol-XL she will discuss it further with her PCP. Currently managed by primary care provider.  Non-insulin-dependent diabetes mellitus type 2:  Educated on importance of glycemic control. Currently managed by primary care provider.  Mixed hyperlipidemia: Continue statin therapy. Most recent lipid profile independently reviewed. Goal LDL less than 70 mg/dL. Does not endorse any myalgias.  Patient was initially referred for evaluation of chest pain.  She has undergone extensive cardiovascular evaluation as noted above.  She is asymptomatic and her test results are overall favorable.  I have encouraged her to focus on lifestyle modifications for primary prevention of CAD with focus on blood pressure, glycemic, and lipid management given her comorbid conditions.  I will see her on as needed basis as needed if any  questions or concerns arise.  She is more than welcome to see me sooner if any questions or concerns need to be addressed.  Thank you for allowing [facility] participate in the care of this patient I have asked her to follow-up with her PCP for management of her other chronic comorbid conditions.  Patient is very thankful for the care provided and is agreement with the plan of care.  FINAL MEDICATION LIST END OF ENCOUNTER: No orders of the defined types were placed in this encounter.   Medications Discontinued During This Encounter  Medication Reason  . sucralfate (CARAFATE) 1 GM/10ML suspension Error  . dexlansoprazole (DEXILANT) 60 MG capsule Error  . pantoprazole (PROTONIX) 40 MG tablet Error  . aspirin EC 81 MG tablet Discontinued by provider     Current Outpatient Medications:  .  Cholecalciferol (D3) 50 MCG (2000 UT) TABS,  Take 1 tablet by mouth daily., Disp: , Rfl:  .  EPINEPHrine 0.3 mg/0.3 mL IJ SOAJ injection, Auvi-Q 0.3 mg/0.3 mL injection, auto-injector, Disp: 2 each, Rfl: 1 .  metFORMIN (GLUCOPHAGE) 500 MG tablet, Take by mouth 2 (two) times daily with a meal., Disp: , Rfl:  .  metoprolol tartrate (LOPRESSOR) 25 MG tablet, Take 1 tablet (25 mg total) by mouth 2 (two) times daily., Disp: 180 tablet, Rfl: 0 .  nitroGLYCERIN (NITROSTAT) 0.4 MG SL tablet, Place 1 tablet (0.4 mg total) under the tongue every 5 (five) minutes as needed for chest pain. If you require more than two tablets five minutes apart go to the nearest ER via EMS., Disp: 30 tablet, Rfl: 0 .  rosuvastatin (CRESTOR) 10 MG tablet, Crestor 10 mg tablet, Disp: , Rfl:  .  valsartan-hydrochlorothiazide (DIOVAN-HCT) 80-12.5 MG tablet, Take 1 tablet by mouth daily., Disp: , Rfl:   No orders of the defined types were placed in this encounter.   There are no Patient Instructions on file for this visit.   --Continue cardiac medications as reconciled in final medication list. --Return if symptoms worsen or fail to improve. Or  sooner if needed. --Continue follow-up with your primary care physician regarding the management of your other chronic comorbid conditions.  Patient's questions and concerns were addressed to her satisfaction. She voices understanding of the instructions provided during this encounter.   This note was created using a voice recognition software as a result there may be grammatical errors inadvertently enclosed that do not reflect the nature of this encounter. Every attempt is made to correct such errors.  Rex Kras, [place], [facility]  Pager: [phone] Office: [phone]

## 2021-04-27 NOTE — Progress Notes (Signed)
Follow Up Note  RE: Amber Barber MRN: [id number] DOB: 08-Sep-1961 Date of Office Visit: 04/28/2021  Referring provider: Renaye Rakers, MD Primary care provider: Renaye Rakers, MD  Chief Complaint: No chief complaint on file.  History of Present Illness: I had the pleasure of seeing Amber Barber for a follow up visit at the [facility] on 04/28/2021. She is a 60 y.o. female, who is being followed for hymenoptera reaction. Her previous allergy office visit was on 09/07/2020 with Nehemiah Settle FNP. Today is a regular follow up visit.  Hymenoptera reaction No stings since the last visit. Has AuviQ on hand if needed. At age 54 - patient had itching, swelling.  Patient is happy with this regimen and not interested in AIT.  Environmental allergies Some sneezing in the spring and does not take any medications for this.  Throat sensation/neck issues/chest: Patient still has some chest fullness and throat fullness - no issues with eating/swallowing foods. Patient had GI evaluation - EGD apparently showed some inflammation.  Not taking any PPIs as they did not work.   03/22/2021 CXR: "IMPRESSION: No active disease."  Hoarseness Resolved.   Assessment and Plan: Amber Barber is a 60 y.o. female with: Anaphylaxis due to hymenoptera venom Past history  - itching and swelling at age 24. No prior AIT. Interim history - no stings and not interested in AIT. No recent testing.  Avoid stinging insects.   For mild symptoms you can take over the counter antihistamines such as Benadryl and monitor symptoms closely. If symptoms worsen or if you have severe symptoms including breathing issues, throat closure, significant swelling, whole body hives, severe diarrhea and vomiting, lightheadedness then inject epinephrine and seek immediate medical care afterwards.  Action plan in place.  Chronic rhinitis No recent testing. Sneezing in the spring. Denies PND symptoms but complaining of  throat fullness.   Continue environmental control measures - pollen.  Start dymista (fluticasone + azelastine nasal spray combination) 1 spray per nostril twice a day.  If it's not covered let us know.   Use over the counter antihistamines such as Zyrtec (cetirizine), Claritin (loratadine), Allegra (fexofenadine), or Xyzal (levocetirizine) daily as needed. May switch antihistamines every few months.  Throat discomfort Question if having some PND contributing to her symptoms. Stopped PPI as not effective.  Start dymista as above and if no improvement follow up with PCP as well.   Chest fullness No prior asthma/copd/inhaler use. Followed by cardiology. Patient complaining of chest fullness. Denies coughing, wheezing or shortness of breath. May 2022 CXR normal.  Today's spirometry was normal.   Follow up with PCP/cardiology regarding this.   Return in about 6 months (around 10/28/2021).  Meds ordered this encounter  Medications  . Azelastine-Fluticasone 137-50 MCG/ACT SUSP    Sig: Place 1 spray into the nose in the morning and at bedtime.    Dispense:  23 g    Refill:  5   Lab Orders  No laboratory test(s) ordered today    Diagnostics: Spirometry:  Tracings reviewed. Her effort: Good reproducible efforts. FVC: 3.93L FEV1: 3.10L, 121% predicted FEV1/FVC ratio: 79% Interpretation: Spirometry consistent with normal pattern.  Please see scanned spirometry results for details.   Medication List:  Current Outpatient Medications  Medication Sig Dispense Refill  . Azelastine-Fluticasone 137-50 MCG/ACT SUSP Place 1 spray into the nose in the morning and at bedtime. 23 g 5  . Cholecalciferol (D3) 50 MCG (2000 UT) TABS Take 1 tablet by mouth daily.    Marland Kitchen  EPINEPHrine 0.3 mg/0.3 mL IJ SOAJ injection Auvi-Q 0.3 mg/0.3 mL injection, auto-injector 2 each 1  . metFORMIN (GLUCOPHAGE) 500 MG tablet Take by mouth 2 (two) times daily with a meal.    . metoprolol tartrate (LOPRESSOR) 25 MG  tablet Take 1 tablet (25 mg total) by mouth 2 (two) times daily. 180 tablet 0  . rosuvastatin (CRESTOR) 10 MG tablet Crestor 10 mg tablet    . valsartan-hydrochlorothiazide (DIOVAN-HCT) 80-12.5 MG tablet Take 1 tablet by mouth daily.    Marland Kitchen azelastine (ASTELIN) 0.1 % nasal spray Place 2 sprays into both nostrils 2 (two) times daily. Use in each nostril as directed 30 mL 5  . fluticasone (FLONASE) 50 MCG/ACT nasal spray Place 2 sprays into both nostrils daily. 1 g 5   No current facility-administered medications for this visit.   Allergies: Allergies  Allergen Reactions  . Bee Venom Anaphylaxis  . Pollen Extract     Seasonal allergies   . Penicillins Rash    Swelling (not throat swelling), swelling in other parts of body   I reviewed her past medical history, social history, family history, and environmental history and no significant changes have been reported from her previous visit.  Review of Systems  Constitutional: Negative for appetite change, chills, fever and unexpected weight change.  HENT: Negative for congestion, rhinorrhea and trouble swallowing.        Throat fullness  Eyes: Negative for itching.  Respiratory: Negative for cough, chest tightness, shortness of breath and wheezing.        Chest fullness  Gastrointestinal: Negative for abdominal pain.  Skin: Negative for rash.  Neurological: Negative for headaches.   Objective: BP 120/88   Pulse 96   Temp 97.8 F (36.6 C) (Temporal)   Resp 16   LMP 12/03/2011   SpO2 98%  There is no height or weight on file to calculate BMI. Physical Exam Vitals and nursing note reviewed.  Constitutional:      Appearance: Normal appearance. She is well-developed.  HENT:     Head: Normocephalic and atraumatic.     Right Ear: Tympanic membrane and external ear normal.     Left Ear: Tympanic membrane and external ear normal.     Nose: Congestion (on right side) present.     Mouth/Throat:     Mouth: Mucous membranes are moist.      Pharynx: Oropharynx is clear.  Eyes:     Conjunctiva/sclera: Conjunctivae normal.  Cardiovascular:     Rate and Rhythm: Normal rate and regular rhythm.     Heart sounds: Normal heart sounds. No murmur heard.   Pulmonary:     Effort: Pulmonary effort is normal.     Breath sounds: Normal breath sounds. No wheezing, rhonchi or rales.  Musculoskeletal:     Cervical back: Neck supple.  Skin:    General: Skin is warm.     Findings: No rash.  Neurological:     Mental Status: She is alert and oriented to person, place, and time.  Psychiatric:        Behavior: Behavior normal.    Previous notes and tests were reviewed. The plan was reviewed with the patient/family, and all questions/concerned were addressed.  It was my pleasure to see Amber Barber today and participate in her care. Please feel free to contact me with any questions or concerns.  Sincerely,  Wyline Mood, DO Allergy & Immunology  [facility] office: [phone] [facility] office: [phone]

## 2021-04-28 ENCOUNTER — Other Ambulatory Visit: Payer: Self-pay

## 2021-04-28 ENCOUNTER — Encounter: Payer: Self-pay | Admitting: Allergy

## 2021-04-28 ENCOUNTER — Ambulatory Visit (INDEPENDENT_AMBULATORY_CARE_PROVIDER_SITE_OTHER): Payer: BC Managed Care – PPO | Admitting: Allergy

## 2021-04-28 VITALS — BP 120/88 | HR 96 | Temp 97.8°F | Resp 16

## 2021-04-28 DIAGNOSIS — R070 Pain in throat: Secondary | ICD-10-CM

## 2021-04-28 DIAGNOSIS — J310 Chronic rhinitis: Secondary | ICD-10-CM | POA: Diagnosis not present

## 2021-04-28 DIAGNOSIS — T63481A Toxic effect of venom of other arthropod, accidental (unintentional), initial encounter: Secondary | ICD-10-CM | POA: Insufficient documentation

## 2021-04-28 DIAGNOSIS — R0789 Other chest pain: Secondary | ICD-10-CM | POA: Insufficient documentation

## 2021-04-28 DIAGNOSIS — T63481D Toxic effect of venom of other arthropod, accidental (unintentional), subsequent encounter: Secondary | ICD-10-CM

## 2021-04-28 MED ORDER — AZELASTINE-FLUTICASONE 137-50 MCG/ACT NA SUSP: 1.0000 | Freq: Two times a day (BID) | NASAL | 5 refills | Status: DC

## 2021-04-28 MED ORDER — AZELASTINE HCL 0.1 % NA SOLN: 2.0000 | Freq: Two times a day (BID) | NASAL | 5 refills | Status: DC

## 2021-04-28 MED ORDER — FLUTICASONE PROPIONATE 50 MCG/ACT NA SUSP: 2.0000 | Freq: Every day | NASAL | 5 refills | Status: DC

## 2021-04-28 NOTE — Patient Instructions (Addendum)
Hymenoptra reaction  Avoid stinging insects.   For mild symptoms you can take over the counter antihistamines such as Benadryl and monitor symptoms closely. If symptoms worsen or if you have severe symptoms including breathing issues, throat closure, significant swelling, whole body hives, severe diarrhea and vomiting, lightheadedness then inject epinephrine and seek immediate medical care afterwards.  Action plan in place.  Environmental allergies  Continue environmental allergies. Start dymista (fluticasone + azelastine nasal spray combination) 1 spray per nostril twice a day.  If it's not covered let us know.    Use over the counter antihistamines such as Zyrtec (cetirizine), Claritin (loratadine), Allegra (fexofenadine), or Xyzal (levocetirizine) daily as needed. May switch antihistamines every few months.   Today's breathing test was normal.  Follow up in 6 months or sooner if needed.  Follow up with PCP regarding the neck/chest symptoms.  Reducing Pollen Exposure . Pollen seasons: trees (spring), grass (summer) and ragweed/weeds (fall). Marland Kitchen Keep windows closed in your home and car to lower pollen exposure.  Lilian Kapur air conditioning in the bedroom and throughout the house if possible.  . Avoid going out in dry windy days - especially early morning. . Pollen counts are highest between 5 - 10 AM and on dry, hot and windy days.  . Save outside activities for late afternoon or after a heavy rain, when pollen levels are lower.  . Avoid mowing of grass if you have grass pollen allergy. Marland Kitchen Be aware that pollen can also be transported indoors on people and pets.  . Dry your clothes in an automatic dryer rather than hanging them outside where they might collect pollen.  . Rinse hair and eyes before bedtime.

## 2021-04-28 NOTE — Assessment & Plan Note (Signed)
No prior asthma/copd/inhaler use. Followed by cardiology. Patient complaining of chest fullness. Denies coughing, wheezing or shortness of breath. May 2022 CXR normal.  Today's spirometry was normal.   Follow up with PCP/cardiology regarding this.

## 2021-04-28 NOTE — Assessment & Plan Note (Signed)
Question if having some PND contributing to her symptoms. Stopped PPI as not effective.  Start dymista as above and if no improvement follow up with PCP as well.

## 2021-04-28 NOTE — Assessment & Plan Note (Signed)
No recent testing. Sneezing in the spring. Denies PND symptoms but complaining of throat fullness.   Continue environmental control measures - pollen.  Start dymista (fluticasone + azelastine nasal spray combination) 1 spray per nostril twice a day.  If it's not covered let us know.   Use over the counter antihistamines such as Zyrtec (cetirizine), Claritin (loratadine), Allegra (fexofenadine), or Xyzal (levocetirizine) daily as needed. May switch antihistamines every few months.

## 2021-04-28 NOTE — Assessment & Plan Note (Signed)
Past history  - itching and swelling at age 60. No prior AIT. Interim history - no stings and not interested in AIT. No recent testing.  Avoid stinging insects.   For mild symptoms you can take over the counter antihistamines such as Benadryl and monitor symptoms closely. If symptoms worsen or if you have severe symptoms including breathing issues, throat closure, significant swelling, whole body hives, severe diarrhea and vomiting, lightheadedness then inject epinephrine and seek immediate medical care afterwards.  Action plan in place.

## 2021-05-13 DIAGNOSIS — K2100 Gastro-esophageal reflux disease with esophagitis, without bleeding: Secondary | ICD-10-CM | POA: Diagnosis not present

## 2021-05-13 DIAGNOSIS — E785 Hyperlipidemia, unspecified: Secondary | ICD-10-CM | POA: Diagnosis not present

## 2021-05-13 DIAGNOSIS — E1169 Type 2 diabetes mellitus with other specified complication: Secondary | ICD-10-CM | POA: Diagnosis not present

## 2021-05-13 DIAGNOSIS — I10 Essential (primary) hypertension: Secondary | ICD-10-CM | POA: Diagnosis not present

## 2021-06-03 DIAGNOSIS — Z86010 Personal history of colonic polyps: Secondary | ICD-10-CM | POA: Diagnosis not present

## 2021-06-03 DIAGNOSIS — Z800 Family history of malignant neoplasm of digestive organs: Secondary | ICD-10-CM | POA: Diagnosis not present

## 2021-06-03 DIAGNOSIS — K5730 Diverticulosis of large intestine without perforation or abscess without bleeding: Secondary | ICD-10-CM | POA: Diagnosis not present

## 2021-06-03 DIAGNOSIS — K2970 Gastritis, unspecified, without bleeding: Secondary | ICD-10-CM | POA: Diagnosis not present

## 2021-06-08 DIAGNOSIS — R635 Abnormal weight gain: Secondary | ICD-10-CM | POA: Diagnosis not present

## 2021-06-08 DIAGNOSIS — E1121 Type 2 diabetes mellitus with diabetic nephropathy: Secondary | ICD-10-CM | POA: Diagnosis not present

## 2021-06-08 DIAGNOSIS — R52 Pain, unspecified: Secondary | ICD-10-CM | POA: Diagnosis not present

## 2021-06-08 DIAGNOSIS — I10 Essential (primary) hypertension: Secondary | ICD-10-CM | POA: Diagnosis not present

## 2021-06-22 DIAGNOSIS — E1121 Type 2 diabetes mellitus with diabetic nephropathy: Secondary | ICD-10-CM | POA: Diagnosis not present

## 2021-06-22 DIAGNOSIS — R52 Pain, unspecified: Secondary | ICD-10-CM | POA: Diagnosis not present

## 2021-06-22 DIAGNOSIS — I10 Essential (primary) hypertension: Secondary | ICD-10-CM | POA: Diagnosis not present

## 2021-09-09 DIAGNOSIS — J310 Chronic rhinitis: Secondary | ICD-10-CM | POA: Diagnosis not present

## 2021-09-09 DIAGNOSIS — R070 Pain in throat: Secondary | ICD-10-CM | POA: Diagnosis not present

## 2021-09-09 DIAGNOSIS — J330 Polyp of nasal cavity: Secondary | ICD-10-CM | POA: Diagnosis not present

## 2021-09-09 DIAGNOSIS — H6123 Impacted cerumen, bilateral: Secondary | ICD-10-CM | POA: Diagnosis not present

## 2021-09-09 DIAGNOSIS — K219 Gastro-esophageal reflux disease without esophagitis: Secondary | ICD-10-CM | POA: Diagnosis not present

## 2021-10-21 DIAGNOSIS — E1121 Type 2 diabetes mellitus with diabetic nephropathy: Secondary | ICD-10-CM | POA: Diagnosis not present

## 2021-10-21 DIAGNOSIS — I10 Essential (primary) hypertension: Secondary | ICD-10-CM | POA: Diagnosis not present

## 2021-10-21 DIAGNOSIS — R1013 Epigastric pain: Secondary | ICD-10-CM | POA: Diagnosis not present

## 2021-10-25 DIAGNOSIS — J338 Other polyp of sinus: Secondary | ICD-10-CM | POA: Diagnosis not present

## 2021-10-25 DIAGNOSIS — J343 Hypertrophy of nasal turbinates: Secondary | ICD-10-CM | POA: Diagnosis not present

## 2021-10-25 DIAGNOSIS — J310 Chronic rhinitis: Secondary | ICD-10-CM | POA: Diagnosis not present

## 2021-10-27 ENCOUNTER — Encounter: Payer: Self-pay | Admitting: Allergy

## 2021-10-27 NOTE — Progress Notes (Signed)
Reviewed notes from Dr. Suszanne Conners. Date of service: 10/25/2021. See scanned notes for full documentation. Patient has nasal polyposis obstructing her middle meatus. Planning on CT scan and continue with Flonase and azelastine nasal spray.Amber Barber

## 2021-10-28 ENCOUNTER — Other Ambulatory Visit: Payer: Self-pay | Admitting: Otolaryngology

## 2021-10-28 DIAGNOSIS — J329 Chronic sinusitis, unspecified: Secondary | ICD-10-CM

## 2021-11-18 ENCOUNTER — Ambulatory Visit
Admission: RE | Admit: 2021-11-18 | Discharge: 2021-11-18 | Disposition: A | Discharge status: Home / Self Care | Payer: BC Managed Care – PPO | Source: Ambulatory Visit | Attending: Otolaryngology | Admitting: Otolaryngology

## 2021-11-18 DIAGNOSIS — J342 Deviated nasal septum: Secondary | ICD-10-CM | POA: Diagnosis not present

## 2021-11-18 DIAGNOSIS — J329 Chronic sinusitis, unspecified: Secondary | ICD-10-CM

## 2021-11-24 ENCOUNTER — Other Ambulatory Visit: Payer: Self-pay | Admitting: Family Medicine

## 2021-11-24 ENCOUNTER — Ambulatory Visit
Admission: RE | Admit: 2021-11-24 | Discharge: 2021-11-24 | Disposition: A | Discharge status: Home / Self Care | Payer: BC Managed Care – PPO | Source: Ambulatory Visit | Attending: Family Medicine | Admitting: Family Medicine

## 2021-11-24 DIAGNOSIS — R0789 Other chest pain: Secondary | ICD-10-CM

## 2021-11-26 ENCOUNTER — Other Ambulatory Visit: Payer: Self-pay | Admitting: Obstetrics and Gynecology

## 2021-11-26 DIAGNOSIS — Z1231 Encounter for screening mammogram for malignant neoplasm of breast: Secondary | ICD-10-CM

## 2021-12-21 DIAGNOSIS — N950 Postmenopausal bleeding: Secondary | ICD-10-CM | POA: Insufficient documentation

## 2021-12-21 DIAGNOSIS — I10 Essential (primary) hypertension: Secondary | ICD-10-CM | POA: Insufficient documentation

## 2022-01-06 ENCOUNTER — Ambulatory Visit
Admission: RE | Admit: 2022-01-06 | Discharge: 2022-01-06 | Disposition: A | Discharge status: Home / Self Care | Payer: BC Managed Care – PPO | Source: Ambulatory Visit | Attending: Obstetrics and Gynecology | Admitting: Obstetrics and Gynecology

## 2022-01-06 DIAGNOSIS — Z1231 Encounter for screening mammogram for malignant neoplasm of breast: Secondary | ICD-10-CM

## 2022-01-25 IMAGING — CT CT HEART MORP W/ CTA COR W/ SCORE W/ CA W/CM &/OR W/O CM
4 of 7 series · 8 of 20 positions shown, 9 images · IV contrast (APPLIED)
Comparison: None.
COMPARISON: None.

Addendum:
EXAM:
OVER-READ INTERPRETATION  CT CHEST

The following report is an over-read performed by radiologist Dr.
Steele Pereda [[email or web address]] on 03/31/2021. This over-read
does not include interpretation of cardiac or coronary anatomy or
pathology. The coronary CTA interpretation by the cardiologist is
attached.
HISTORY: Chest pain, nonspecific
Cardiac/Coronary  CT
TECHNIQUE: The patient was scanned on a Siemens Force scanner.
PROTOCOL: A 120 kV prospective scan was triggered in the descending thoracic
aorta at 111 HU's. Axial non-contrast 3 mm slices were carried out
through the heart. The data set was analyzed on a dedicated work
station and scored using the Agatson method. Gantry rotation speed
was 250 msecs and collimation was .6 mm. 10mg IV lopressor and
mg of sl NTG was given. The 3D data set was reconstructed in 5%
intervals of the 67-82 % of the R-R cycle. Diastolic phases were
analyzed on a dedicated work station using MPR, MIP and VRT modes.
The patient received 100mL OMNIPAQUE IOHEXOL 350 MG/ML SOLN of
contrast.

[Series 7: best diast 76 % · axial · 0.39mm/px · z∈[-362,-320]mm · 2 of 316 slices shown]
[im 106/316  vessel]
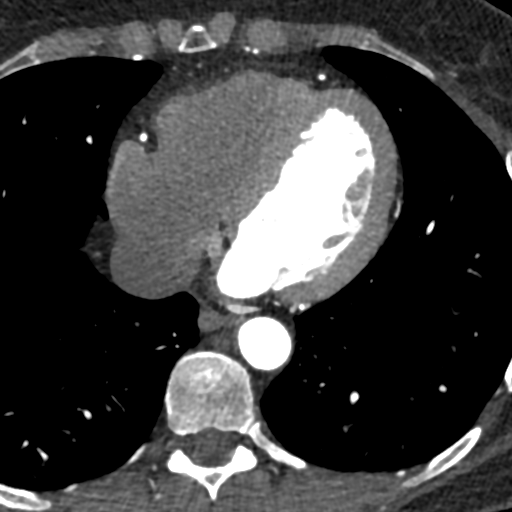
[im 211/316  vessel]
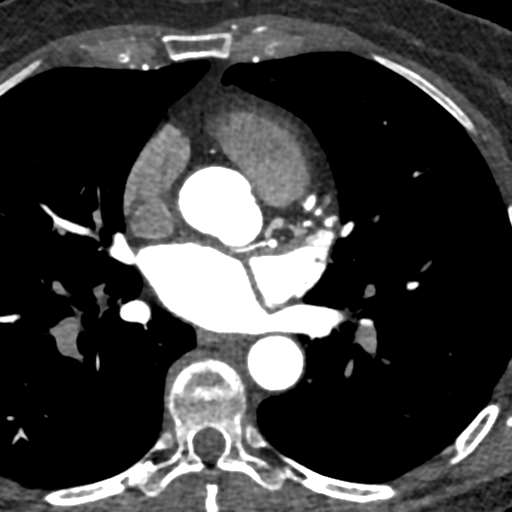

[Series 8: best syst · axial · 0.39mm/px · z∈[-362,-320]mm · 2 of 316 slices shown, 3 images]
[im 106/316  vessel]
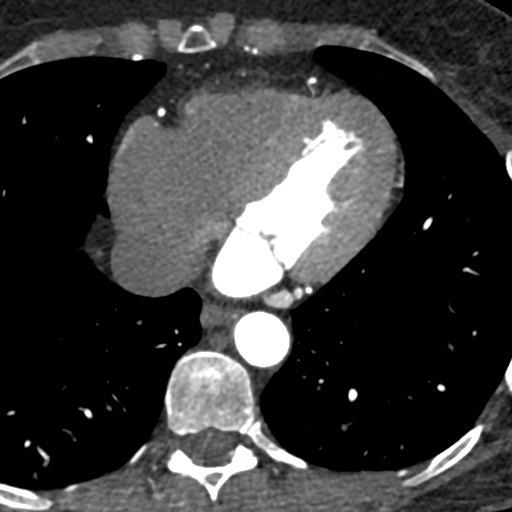
[im 106/316  lung]
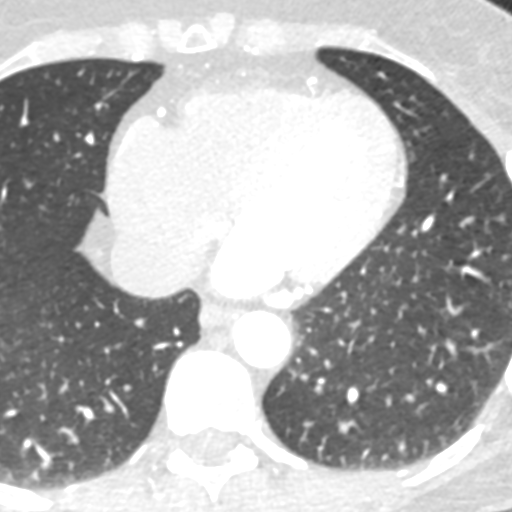
[im 211/316  vessel]
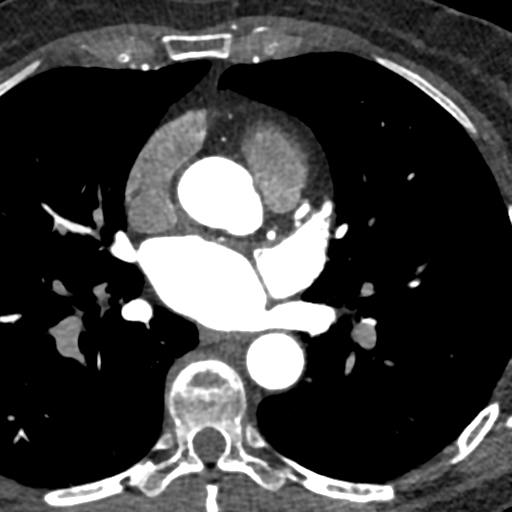

[Series 10: ts syst sharp · axial · 0.39mm/px · z∈[-362,-320]mm · 2 of 316 slices shown]
[im 106/316  lung]
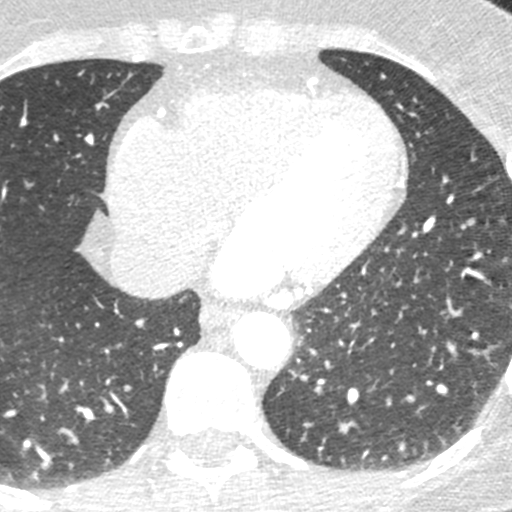
[im 211/316  lung]
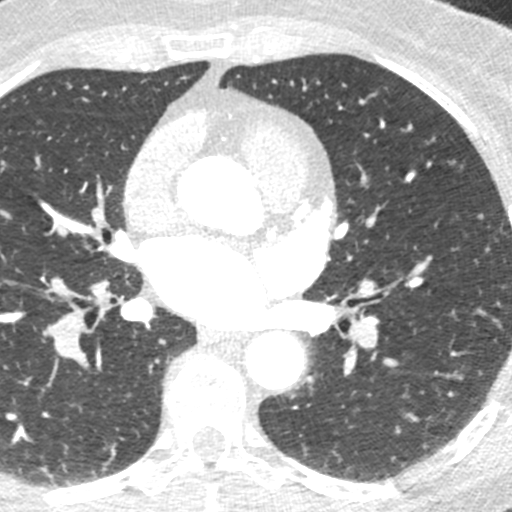

[Series 11: ts diast sharp 76 % · axial · 0.39mm/px · z∈[-362,-320]mm · 2 of 316 slices shown]
[im 106/316  lung]
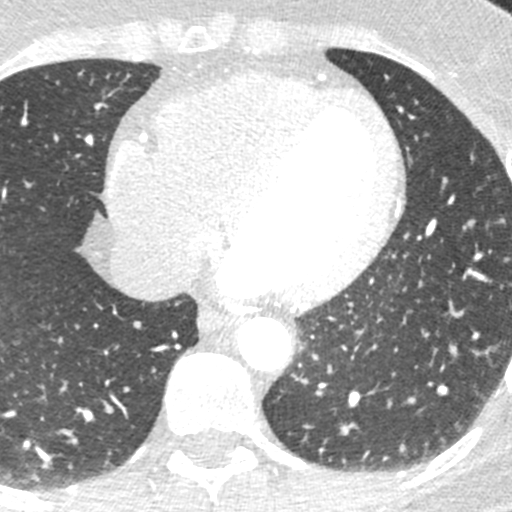
[im 211/316  lung]
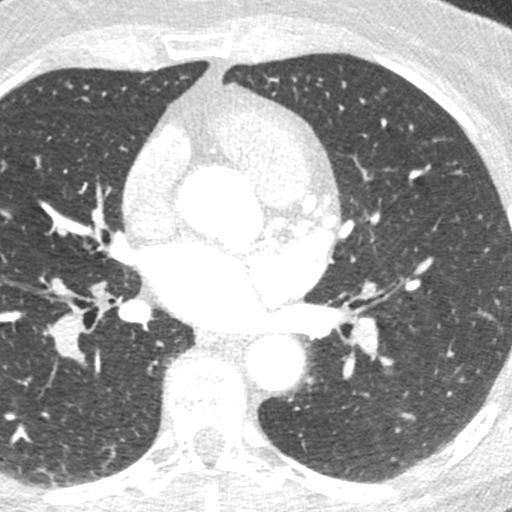

[8 of 20 positions shown; findings below may reference images not displayed]

FINDINGS: Vascular: Heart is normal size.  Aorta normal caliber.

Mediastinum/Nodes: No adenopathy

Lungs/Pleura: Visualized lungs clear.  No effusions.

Upper Abdomen: Imaging into the upper abdomen demonstrates no acute
findings.

Musculoskeletal: Chest wall soft tissues are unremarkable. No acute
bony abnormality.
IMPRESSION: No acute or significant extracardiac abnormality.
FINDINGS: Image quality: Average.

Noise artifact is: moderate (movement and respiratory motion).

Coronary artery calcification score: Total coronary calcium score of
0.

Coronary arteries: Normal coronary origins.  Right dominance.

Left Main Coronary Artery: The left main is a normal caliber vessel,
short in length with a normal take off from the left coronary cusp
that bifurcates to form a left anterior descending artery and a left
circumflex artery. There is no plaque or stenosis.

Left Anterior Descending Coronary Artery: Normal caliber vessel that
reaches the apex and gives rise to four diagonal branches. LAD and
all four diagonal branches are patent without evidence of plaque or
stenosis.

Left Circumflex Artery: Normal caliber vessel, non-dominant, travels
within the atrioventricular groove and gives off two obtuse marginal
branches. The LCx and both obtuse marginal branches are patent with
no evidence of plaque or stenosis.

Right Coronary Artery: The RCA is dominant with normal take off from
the right coronary cusp. The RCA terminates as a PDA and right
posterolateral branch without evidence of plaque or stenosis.

Left Atrium: Grossly normal in size with no left atrial appendage
filling defect.

Left Ventricle: Grossly normal in size. There are no stigmata of
prior infarction. There is no abnormal filling defect.

Pulmonary arteries: Normal in size without proximal filling defect.

Pulmonary veins: Normal pulmonary venous drainage.

Aorta: Normal size, 29 mm at the mid ascending aorta (level of the
PA bifurcation) measured double oblique. No calcifications. No
dissection.

Pericardium: Normal thickness with no significant effusion or
calcium present.

Cardiac valves: The aortic valve is trileaflet without
calcification. The mitral valve is normal structure without
calcification.

Extra-cardiac findings: See attached radiology report for
non-cardiac structures.
IMPRESSION: 1. Total coronary calcium score of 0.

2. Normal coronary origin with right dominance.

3. CAD-RADS = 0.

RECOMMENDATIONS:

No evidence of CAD (0%). Consider non-atherosclerotic causes of
chest pain.

*** End of Addendum ***
EXAM:
OVER-READ INTERPRETATION  CT CHEST

The following report is an over-read performed by radiologist Dr.
Steele Pereda [[email or web address]] on 03/31/2021. This over-read
does not include interpretation of cardiac or coronary anatomy or
pathology. The coronary CTA interpretation by the cardiologist is
attached.
FINDINGS: Vascular: Heart is normal size.  Aorta normal caliber.

Mediastinum/Nodes: No adenopathy

Lungs/Pleura: Visualized lungs clear.  No effusions.

Upper Abdomen: Imaging into the upper abdomen demonstrates no acute
findings.

Musculoskeletal: Chest wall soft tissues are unremarkable. No acute
bony abnormality.
IMPRESSION: No acute or significant extracardiac abnormality.

## 2022-01-27 ENCOUNTER — Telehealth: Payer: Self-pay | Admitting: Allergy

## 2022-01-27 ENCOUNTER — Other Ambulatory Visit: Payer: Self-pay

## 2022-01-27 MED ORDER — FLUTICASONE PROPIONATE 50 MCG/ACT NA SUSP: 2.0000 | Freq: Every day | NASAL | 1 refills | Status: DC

## 2022-01-27 MED ORDER — EPINEPHRINE 0.3 MG/0.3ML IJ SOAJ: 0.3000 mg | INTRAMUSCULAR | 0 refills | Status: DC | PRN

## 2022-01-27 NOTE — Telephone Encounter (Signed)
Patient informed. 

## 2022-01-27 NOTE — Telephone Encounter (Signed)
I called the patient to inform her a refill of the epi pen and Flonase has been sent in to [facility]. Patient verbalized understanding she will need an appointment for further refills. She plans to call back when able to schedule her next appointment.  ?

## 2022-01-27 NOTE — Telephone Encounter (Signed)
Called and left a voicemail asking for patient to return call to inform of refills being sent in.  ?

## 2022-01-27 NOTE — Telephone Encounter (Signed)
Patient needs refills on Epi-pen and Flonase sent to [facility] ?

## 2022-01-28 ENCOUNTER — Other Ambulatory Visit: Payer: Self-pay | Admitting: *Deleted

## 2022-01-28 MED ORDER — AZELASTINE HCL 0.1 % NA SOLN: 2.0000 | Freq: Two times a day (BID) | NASAL | 5 refills | Status: DC

## 2022-01-28 MED ORDER — EPINEPHRINE 0.3 MG/0.3ML IJ SOAJ: 0.3000 mg | INTRAMUSCULAR | 1 refills | Status: DC | PRN

## 2022-01-28 NOTE — Telephone Encounter (Signed)
Amber Barber CALLED AND STATED THAT EPI PEN IS NOT AVAILABLE AT PREVIOUS PHARMACY. SHE WOULD LIKE IT CALLED IN TO [facility] ON [address] IN [place]. SHE ALSO STATED THAT SHE ASKED FOR THE WRONG MEDICATION YESTERDAY. SHE ACTUALLY NEEDS AZELASTINE HCI. SHE NEEDS THAT SENT TO [facility] IN [facility] [place] [facility]. ?

## 2022-01-28 NOTE — Telephone Encounter (Signed)
Prescriptions have been sent in to requested pharmacies. Called patient and advised. Patient verbalized understanding.  

## 2022-08-30 ENCOUNTER — Ambulatory Visit: Payer: BC Managed Care – PPO | Admitting: Allergy and Immunology

## 2022-08-30 VITALS — BP 98/68 | HR 100 | Temp 98.2°F | Resp 20 | Ht 69.0 in | Wt 168.6 lb

## 2022-08-30 DIAGNOSIS — K219 Gastro-esophageal reflux disease without esophagitis: Secondary | ICD-10-CM

## 2022-08-30 DIAGNOSIS — T63481A Toxic effect of venom of other arthropod, accidental (unintentional), initial encounter: Secondary | ICD-10-CM

## 2022-08-30 DIAGNOSIS — T63481D Toxic effect of venom of other arthropod, accidental (unintentional), subsequent encounter: Secondary | ICD-10-CM | POA: Diagnosis not present

## 2022-08-30 MED ORDER — FAMOTIDINE 40 MG PO TABS: 40.0000 mg | ORAL_TABLET | Freq: Every evening | ORAL | 5 refills | Status: DC

## 2022-08-30 MED ORDER — OMEPRAZOLE 40 MG PO CPDR: 40.0000 mg | DELAYED_RELEASE_CAPSULE | Freq: Two times a day (BID) | ORAL | 5 refills | Status: DC

## 2022-08-30 MED ORDER — CARBINOXAMINE MALEATE 4 MG PO TABS: 2.0000 | ORAL_TABLET | Freq: Two times a day (BID) | ORAL | 5 refills | Status: AC

## 2022-08-30 MED ORDER — EPINEPHRINE 0.3 MG/0.3ML IJ SOAJ: 0.3000 mg | INTRAMUSCULAR | 1 refills | Status: DC | PRN

## 2022-08-30 NOTE — Progress Notes (Signed)
[place]   Follow-up Note  Referring Provider: Lucianne Lei, MD Primary Provider: Lucianne Lei, MD Date of Office Visit: 08/30/2022  Subjective:   Amber Barber (DOB: Dec 04, 1960) is a 61 y.o. female who returns to the [facility] on 08/30/2022 in re-evaluation of the following:  HPI: Amber Barber resents to this clinic in evaluation of her throat problem.  I have not seen her in this clinic in many many years.  She was last seen by Dr. Maudie Mercury 28 April 2021.  Since that last visit she has been thoroughly evaluated by the voice disorder center at [facility].  And apparently she had an upper endoscopy performed which identified "inflammation".  She is currently using omeprazole at night and a famotidine in the morning.  Her complaint is that she has a "pulling" sensation in her throat.  She is not having any raspy voice and she is not really having any throat clearing although there might be some coating in her throat.  She does not really have any associated upper airway symptoms.  She is not having any classic reflux symptoms at this point in time.  She does not consume caffeine although she has chocolate about 3 times per week.  She also has a history of hymenoptera venom exposure for which she carries an EpiPen.  She is not interested in undergoing immunotherapy for this condition.  Allergies as of 08/30/2022       Reactions   Bee Venom Anaphylaxis   Bee Pollen Cough   Seasonal allergies    Penicillins Rash   Swelling (not throat swelling), swelling in other parts of body   Pollen Extract Cough   Seasonal allergies         Medication List    azelastine 0.1 % nasal spray Commonly known as: ASTELIN Place 2 sprays into both nostrils 2 (two) times daily. Use in each nostril as directed   Azelastine-Fluticasone 137-50 MCG/ACT Susp Place 1 spray into the nose in the morning and at bedtime.   cyclobenzaprine 10 MG  tablet Commonly known as: FLEXERIL Take 10 mg by mouth 3 (three) times daily.   D3 50 MCG (2000 UT) Tabs Generic drug: Cholecalciferol Take 1 tablet by mouth daily.   EPINEPHrine 0.3 mg/0.3 mL Soaj injection Commonly known as: EPI-PEN Inject 0.3 mg into the muscle as needed for anaphylaxis.   EPINEPHrine 0.3 mg/0.3 mL Soaj injection Commonly known as: EPI-PEN Inject 0.3 mg into the muscle as needed for anaphylaxis.   Farxiga 10 MG Tabs tablet Generic drug: dapagliflozin propanediol Take 10 mg by mouth daily.   glucose blood test strip OneTouch Verio test strips   OneTouch Verio test strip Generic drug: glucose blood OneTouch Verio test strips   OneTouch Verio test strip Generic drug: glucose blood   metoprolol tartrate 25 MG tablet Commonly known as: LOPRESSOR Take 1 tablet (25 mg total) by mouth 2 (two) times daily.   nitroGLYCERIN 0.4 MG SL tablet Commonly known as: NITROSTAT [DISSOLVE 1 TABLET UNDER THE TOUNGE EVERY 5 MINUTES AS NEEDED FOR CHEST PAIN (GO TO THE ER IF YOU REQUIRE MORE THAN 2 TABLETS 5 MINUTES APART)]   omeprazole 40 MG capsule Commonly known as: PRILOSEC Take 1 capsule ORALLY once a day   OneTouch Delica Plus [id number] Misc OneTouch Delica Plus Lancet 33 gauge   AgaMatrix Ultra-Thin Lancets Misc OneTouch Delica Plus Lancet 33 gauge   OneTouch Delica Plus [id number] Misc Apply topically.  OneTouch Verio Flex System w/Device Kit IT trainer Blood Glucose Monitor Devi OneTouch Verio Flex Meter   Ozempic (0.25 or 0.5 MG/DOSE) 2 MG/1.5ML Sopn Generic drug: Semaglutide(0.25 or 0.5MG/DOS) INJECT 0.25 MG SUBCUTANEOUSLY WEEKLY   rosuvastatin 10 MG tablet Commonly known as: CRESTOR Crestor 10 mg tablet   valsartan-hydrochlorothiazide 80-12.5 MG tablet Commonly known as: DIOVAN-HCT Take 1 tablet by mouth daily.     Past Medical History:  Diagnosis Date  . Diabetes mellitus without complication ([place])   .  Hyperlipidemia   . Hypertension   . LSIL (low grade squamous intraepithelial lesion) on Pap smear 2010    Past Surgical History:  Procedure Laterality Date  . BREAST BIOPSY Bilateral 2010,2012   benign  . BREAST SURGERY     core biopsy- L breast   . COLONOSCOPY     long time  . COLPOSCOPY  2010  . ECTOPIC PREGNANCY SURGERY     x2  . HAMMER TOE SURGERY     left little toe  . SHOULDER ARTHROSCOPY Right 09/19/2013   Procedure: RIGHT ARTHROSCOPY SHOULDER WITH ACROMIOPLASTY ;  Surgeon: Sharmon Revere, MD;  Location: [place];  Service: Orthopedics;  Laterality: Right;  . TUBAL LIGATION      Review of systems negative except as noted in HPI / PMHx or noted below:  Review of Systems  Constitutional: Negative.   HENT: Negative.    Eyes: Negative.   Respiratory: Negative.    Cardiovascular: Negative.   Gastrointestinal: Negative.   Genitourinary: Negative.   Musculoskeletal: Negative.   Skin: Negative.   Neurological: Negative.   Endo/Heme/Allergies: Negative.   Psychiatric/Behavioral: Negative.       Objective:   Vitals:   08/30/22 1145  BP: 98/68  Pulse: 100  Resp: 20  Temp: 98.2 F (36.8 C)  SpO2: 98%   Height: 5' 9" (175.3 cm)  Weight: 168 lb 9.6 oz (76.5 kg)   Physical Exam Constitutional:      Appearance: She is not diaphoretic.  HENT:     Head: Normocephalic.     Right Ear: Tympanic membrane, ear canal and external ear normal.     Left Ear: Tympanic membrane, ear canal and external ear normal.     Nose: Nose normal. No mucosal edema or rhinorrhea.     Mouth/Throat:     Pharynx: Uvula midline. No oropharyngeal exudate.  Eyes:     Conjunctiva/sclera: Conjunctivae normal.  Neck:     Thyroid: No thyromegaly.     Trachea: Trachea normal. No tracheal tenderness or tracheal deviation.  Cardiovascular:     Rate and Rhythm: Normal rate and regular rhythm.     Heart sounds: Normal heart sounds, S1 normal and S2 normal. No murmur heard. Pulmonary:      Effort: No respiratory distress.     Breath sounds: Normal breath sounds. No stridor. No wheezing or rales.  Lymphadenopathy:     Head:     Right side of head: No tonsillar adenopathy.     Left side of head: No tonsillar adenopathy.     Cervical: No cervical adenopathy.  Skin:    Findings: No erythema or rash.     Nails: There is no clubbing.  Neurological:     Mental Status: She is alert.    Diagnostics:   Results of a neck CT scan obtained 17 January 2022 identified the following:   1.  Slight asymmetric soft tissue fullness at the level of the left palatine tonsil  with an associated 11 mm hypoattenuating lesion with mild peripheral enhancement inseparable from the superior margin. Findings raise concern for a small necrotic mass over abscess given the lack of adjacent inflammatory changes, though correlation with direct visualization and clinical suspicion is advised.  2.  No suspicious lymphadenopathy in the neck.  3.  Course of the left recurrent laryngeal nerve is incompletely evaluated with AP window excluded from field-of-view, though there is no CT evidence of left vocal cord paresis.  Results of a upper airway biopsy obtained 15 February 2022 identified the following:   1.  Slight asymmetric soft tissue fullness at the level of the left palatine tonsil with an associated 11 mm hypoattenuating lesion with mild peripheral enhancement inseparable from the superior margin. Findings raise concern for a small necrotic mass over abscess given the lack of adjacent inflammatory changes, though correlation with direct visualization and clinical suspicion is advised.  2.  No suspicious lymphadenopathy in the neck.  3.  Course of the left recurrent laryngeal nerve is incompletely evaluated with AP window excluded from field-of-view, though there is no CT evidence of left vocal cord paresis.  Assessment and Plan:   1. LPRD (laryngopharyngeal reflux disease)   2. Systemic reaction to  hymenoptera sting      1.  Treat reflux:   A.  Omeprazole 40 mg -1 tablet twice a day  B.  Famotidine 40 mg -1 tablet once a day in the evening  2.  If needed:   A.  Carbinoxamine 4 mg - 1-2 tablets 1-2 times per day  3.  EpiPen  4.  Return to clinic in 4 weeks or earlier if problem  5.  Obtain fall flu vaccine and RSV vaccine.  Allena Katz, MD Allergy / Immunology [place]

## 2022-08-30 NOTE — Patient Instructions (Addendum)
  1.  Treat reflux:   A.  Omeprazole 40 mg -1 tablet twice a day  B.  Famotidine 40 mg -1 tablet once a day in the evening  2.  If needed:   A.  Carbinoxamine 4 mg - 1-2 tablets 1-2 times per day  3.  EpiPen  4.  Return to clinic in 4 weeks or earlier if problem  5.  Obtain fall flu vaccine and RSV vaccine.

## 2022-08-31 ENCOUNTER — Encounter: Payer: Self-pay | Admitting: Allergy and Immunology

## 2022-10-04 ENCOUNTER — Encounter: Payer: Self-pay | Admitting: Allergy and Immunology

## 2022-10-04 ENCOUNTER — Ambulatory Visit (INDEPENDENT_AMBULATORY_CARE_PROVIDER_SITE_OTHER): Payer: BC Managed Care – PPO | Admitting: Allergy and Immunology

## 2022-10-04 VITALS — BP 122/70 | HR 71 | Temp 97.7°F | Resp 16 | Ht 69.0 in | Wt 168.0 lb

## 2022-10-04 DIAGNOSIS — K219 Gastro-esophageal reflux disease without esophagitis: Secondary | ICD-10-CM | POA: Diagnosis not present

## 2022-10-04 DIAGNOSIS — T63481A Toxic effect of venom of other arthropod, accidental (unintentional), initial encounter: Secondary | ICD-10-CM

## 2022-10-04 MED ORDER — EPINEPHRINE 0.3 MG/0.3ML IJ SOAJ: 0.3000 mg | INTRAMUSCULAR | 1 refills | Status: DC | PRN

## 2022-10-04 MED ORDER — OMEPRAZOLE 40 MG PO CPDR: 40.0000 mg | DELAYED_RELEASE_CAPSULE | Freq: Two times a day (BID) | ORAL | 11 refills | Status: DC

## 2022-10-04 NOTE — Patient Instructions (Signed)
  1.  Taper reflux therapy:   A.  Discontinue famotidine today B.  December 15: decrease omeprazole 40 mg-1 time per day   2.  EpiPen  3.  Return to clinic in 1 year or earlier if problem  4.  Obtain RSV vaccine.

## 2022-10-04 NOTE — Progress Notes (Unsigned)
[facility] [place] [facility] [place]   Follow-up Note  Referring Provider: Lucianne Lei, MD Primary Provider: Lin Landsman, MD Date of Office Visit: 10/04/2022  Subjective:   Amber Barber (DOB: 1961-11-04) is a 60 y.o. female who returns to the [facility] [place] [facility] on 10/04/2022 in re-evaluation of the following:  HPI: Amber Barber to this clinic in evaluation of throat issues and a history of hymenoptera venom sting.  I last saw her in this clinic 30 August 2022.  She continues to have the issue with the pulling sensation in her throat.  She has come to the realization that this issue is probably not going to resolve.  She continues to do throat exercises as prescribed by [facility].  She continues on aggressive therapy directed against reflux and at this point time has no reflux symptoms.  She continues to carry an EpiPen for her hymenoptera venom hypersensitivity state  Allergies as of 10/04/2022       Reactions   Bee Venom Anaphylaxis   Bee Pollen Cough   Seasonal allergies    Penicillins Rash   Swelling (not throat swelling), swelling in other parts of body   Pollen Extract Cough   Seasonal allergies         Medication List    Carbinoxamine Maleate 4 MG Tabs Take 2 tablets (8 mg total) by mouth 2 (two) times daily.   cyclobenzaprine 10 MG tablet Commonly known as: FLEXERIL Take 10 mg by mouth 3 (three) times daily.   D3 50 MCG (2000 UT) Tabs Generic drug: Cholecalciferol Take 1 tablet by mouth daily.   EPINEPHrine 0.3 mg/0.3 mL Soaj injection Commonly known as: EPI-PEN Inject 0.3 mg into the muscle as needed for anaphylaxis.   famotidine 40 MG tablet Commonly known as: PEPCID Take 1 tablet (40 mg total) by mouth at bedtime.   Farxiga 10 MG Tabs tablet Generic drug: dapagliflozin propanediol Take 10 mg by mouth daily.   glucose blood test strip OneTouch Verio test strips   OneTouch Verio test strip Generic  drug: glucose blood OneTouch Verio test strips   OneTouch Verio test strip Generic drug: glucose blood   metoprolol tartrate 25 MG tablet Commonly known as: LOPRESSOR Take 1 tablet (25 mg total) by mouth 2 (two) times daily.   nitroGLYCERIN 0.4 MG SL tablet Commonly known as: NITROSTAT [DISSOLVE 1 TABLET UNDER THE TOUNGE EVERY 5 MINUTES AS NEEDED FOR CHEST PAIN (GO TO THE ER IF YOU REQUIRE MORE THAN 2 TABLETS 5 MINUTES APART)]   omeprazole 40 MG capsule Commonly known as: PRILOSEC Take 1 capsule (40 mg total) by mouth 2 (two) times daily.   OneTouch Delica Plus [id number] Misc OneTouch Delica Plus Lancet 33 gauge   AgaMatrix Ultra-Thin Lancets Misc OneTouch Delica Plus Lancet 33 gauge   OneTouch Delica Plus [id number] Misc Apply topically.   OneTouch Verio Flex System w/Device Kit IT trainer Blood Glucose Monitor Devi OneTouch Verio Flex Meter   Ozempic (0.25 or 0.5 MG/DOSE) 2 MG/1.5ML Sopn Generic drug: Semaglutide(0.25 or 0.5MG/DOS) INJECT 0.25 MG SUBCUTANEOUSLY WEEKLY   rosuvastatin 10 MG tablet Commonly known as: CRESTOR Crestor 10 mg tablet   valsartan-hydrochlorothiazide 80-12.5 MG tablet Commonly known as: DIOVAN-HCT Take 1 tablet by mouth daily.        Past Medical History:  Diagnosis Date   Diabetes mellitus without complication ([facility])    Hyperlipidemia    Hypertension    LSIL (  low grade squamous intraepithelial lesion) on Pap smear 2010    Past Surgical History:  Procedure Laterality Date   BREAST BIOPSY Bilateral 2010,2012   benign   BREAST SURGERY     core biopsy- L breast    COLONOSCOPY     long time   COLPOSCOPY  2010   ECTOPIC PREGNANCY SURGERY     x2   HAMMER TOE SURGERY     left little toe   SHOULDER ARTHROSCOPY Right 09/19/2013   Procedure: RIGHT ARTHROSCOPY SHOULDER WITH ACROMIOPLASTY ;  Surgeon: Sharmon Revere, MD;  Location: [place];  Service: Orthopedics;  Laterality: Right;   TUBAL LIGATION       Review of systems negative except as noted in HPI / PMHx or noted below:  Review of Systems  Constitutional: Negative.   HENT: Negative.    Eyes: Negative.   Respiratory: Negative.    Cardiovascular: Negative.   Gastrointestinal: Negative.   Genitourinary: Negative.   Musculoskeletal: Negative.   Skin: Negative.   Neurological: Negative.   Endo/Heme/Allergies: Negative.   Psychiatric/Behavioral: Negative.       Objective:   Vitals:   10/04/22 0936  BP: 122/70  Pulse: 71  Resp: 16  Temp: 97.7 F (36.5 C)  SpO2: 100%   Height: _0  (175.3 cm)  Weight: 168 lb (76.2 kg)   Physical Exam Constitutional:      Appearance: She is not diaphoretic.  HENT:     Head: Normocephalic.     Right Ear: Tympanic membrane, ear canal and external ear normal.     Left Ear: Tympanic membrane, ear canal and external ear normal.     Nose: Nose normal. No mucosal edema or rhinorrhea.     Mouth/Throat:     Pharynx: Uvula midline. No oropharyngeal exudate.  Eyes:     Conjunctiva/sclera: Conjunctivae normal.  Neck:     Thyroid: No thyromegaly.     Trachea: Trachea normal. No tracheal tenderness or tracheal deviation.  Cardiovascular:     Rate and Rhythm: Normal rate and regular rhythm.     Heart sounds: Normal heart sounds, S1 normal and S2 normal. No murmur heard. Pulmonary:     Effort: No respiratory distress.     Breath sounds: Normal breath sounds. No stridor. No wheezing or rales.  Musculoskeletal:     Right lower leg: No edema.  Lymphadenopathy:     Head:     Right side of head: No tonsillar adenopathy.     Left side of head: No tonsillar adenopathy.     Cervical: No cervical adenopathy.  Skin:    Findings: No erythema or rash.     Nails: There is no clubbing.  Neurological:     Mental Status: She is alert.     Diagnostics: none   Assessment and Plan:   1. LPRD (laryngopharyngeal reflux disease)   2. Systemic reaction to hymenoptera sting    1.  Taper reflux  therapy:   A.  Discontinue famotidine today B.  December 15: decrease omeprazole 40 mg-1 time per day   2.  EpiPen  3.  Return to clinic in 1 year or earlier if problem  4.  Obtain RSV vaccine.  We will now see if we can taper reflux medications prescribed for Amber Barber as there may be a lower requirement for these medications now that she is doing better.  We will have her discontinue her famotidine today and then within a month we will have her decrease her omeprazole  from twice a day to once a day.  Allena Katz, MD Allergy / Immunology [place]

## 2022-10-05 ENCOUNTER — Encounter: Payer: Self-pay | Admitting: Allergy and Immunology

## 2022-11-22 ENCOUNTER — Other Ambulatory Visit: Payer: Self-pay | Admitting: Family Medicine

## 2022-11-22 DIAGNOSIS — Z1231 Encounter for screening mammogram for malignant neoplasm of breast: Secondary | ICD-10-CM

## 2022-12-14 ENCOUNTER — Ambulatory Visit: Payer: BC Managed Care – PPO | Admitting: Internal Medicine

## 2022-12-14 ENCOUNTER — Encounter: Payer: Self-pay | Admitting: Internal Medicine

## 2022-12-14 VITALS — BP 117/74 | HR 88 | Ht 69.0 in | Wt 167.0 lb

## 2022-12-14 DIAGNOSIS — E782 Mixed hyperlipidemia: Secondary | ICD-10-CM

## 2022-12-14 DIAGNOSIS — R072 Precordial pain: Secondary | ICD-10-CM

## 2022-12-14 DIAGNOSIS — I10 Essential (primary) hypertension: Secondary | ICD-10-CM

## 2022-12-14 DIAGNOSIS — E119 Type 2 diabetes mellitus without complications: Secondary | ICD-10-CM

## 2022-12-14 NOTE — Progress Notes (Signed)
Date:  03/19/2021   ID:  Amber Barber, DOB 22-Jul-1961, MRN [id number]  PCP:  Lin Landsman, MD  Cardiologist:  Floydene Flock, DO, [facility] (established care 03/19/2021)  Date: 12/17/22 Last Office Visit: 03/19/2021  Chief Complaint  Patient presents with   Chest Pain   Follow-up    HPI  Amber Barber is a 62 y.o. African-American female who presents to the office with a chief complaint of " reevaluation of chest pain." Patient's past medical history and cardiovascular risk factors include: Benign essential hypertension, hyperlipidemia, diabetes mellitus type 2 non-insulin-dependent, postmenopausal female.  Patient presents for follow-up visit due to chest pain. She brought some papers with her from prior ED visits regarding costochondritis which we both agree is what she likely has right now. Patient does not have any chest pain or shortness of breath with activity. She has been doing well since the last time she was here aside from her ED visit but she is doing much better now. She denies palpitations, diaphoresis, syncope, claudication, edema, orthopnea.   FUNCTIONAL STATUS: Walks 3-4 times a week for approximately 35 minutes.  ALLERGIES: Allergies  Allergen Reactions   Bee Venom Anaphylaxis   Bee Pollen Cough    Seasonal allergies    Penicillins Rash    Swelling (not throat swelling), swelling in other parts of body   Pollen Extract Cough    Seasonal allergies     MEDICATION LIST PRIOR TO VISIT: Current Meds  Medication Sig   Cholecalciferol (D3) 50 MCG (2000 UT) TABS Take 1 tablet by mouth daily.   cyclobenzaprine (FLEXERIL) 10 MG tablet Take 10 mg by mouth 3 (three) times daily.   EPINEPHrine 0.3 mg/0.3 mL IJ SOAJ injection Inject 0.3 mg into the muscle as needed for anaphylaxis.   famotidine (PEPCID) 40 MG tablet Take 1 tablet (40 mg total) by mouth at bedtime.   FARXIGA 10 MG TABS tablet Take 10 mg by mouth daily.   metoprolol tartrate (LOPRESSOR) 25 MG tablet Take 1  tablet (25 mg total) by mouth 2 (two) times daily. (Patient taking differently: Take 25 mg by mouth daily.)   nitroGLYCERIN (NITROSTAT) 0.4 MG SL tablet [DISSOLVE 1 TABLET UNDER THE TOUNGE EVERY 5 MINUTES AS NEEDED FOR CHEST PAIN (GO TO THE ER IF YOU REQUIRE MORE THAN 2 TABLETS 5 MINUTES APART)]   omeprazole (PRILOSEC) 40 MG capsule Take 1 capsule (40 mg total) by mouth 2 (two) times daily. (Patient taking differently: Take 40 mg by mouth as needed.)   ONETOUCH VERIO test strip    rosuvastatin (CRESTOR) 20 MG tablet Take 20 mg by mouth at bedtime.   Semaglutide,0.25 or 0.5MG /DOS, (OZEMPIC, 0.25 OR 0.5 MG/DOSE,) 2 MG/1.5ML SOPN INJECT 0.25 MG SUBCUTANEOUSLY WEEKLY   valsartan (DIOVAN) 80 MG tablet Take 80 mg by mouth daily.   [DISCONTINUED] rosuvastatin (CRESTOR) 10 MG tablet Crestor 10 mg tablet     PAST MEDICAL HISTORY: Past Medical History:  Diagnosis Date   Diabetes mellitus without complication ([place])    Hyperlipidemia    Hypertension    LSIL (low grade squamous intraepithelial lesion) on Pap smear 2010    PAST SURGICAL HISTORY: Past Surgical History:  Procedure Laterality Date   BREAST BIOPSY Bilateral 2010,2012   benign   BREAST SURGERY     core biopsy- L breast    COLONOSCOPY     long time   COLPOSCOPY  2010   ECTOPIC PREGNANCY SURGERY     x2   HAMMER TOE SURGERY  left little toe   SHOULDER ARTHROSCOPY Right 09/19/2013   Procedure: RIGHT ARTHROSCOPY SHOULDER WITH ACROMIOPLASTY ;  Surgeon: Sharmon Revere, MD;  Location: [facility];  Service: Orthopedics;  Laterality: Right;   TUBAL LIGATION      FAMILY HISTORY: The patient family history includes Breast cancer in her maternal aunt; Diabetes in her father and maternal grandmother; Heart disease in her sister; Hypertension in her father, maternal grandmother, and mother; Thyroid disease in her sister.  SOCIAL HISTORY:  The patient  reports that she has never smoked. She has never used smokeless tobacco. She reports current  alcohol use. She reports that she does not use drugs.  REVIEW OF SYSTEMS: Review of Systems  Constitutional: Negative for chills and fever.  HENT:  Negative for hoarse voice and nosebleeds.   Eyes:  Negative for discharge, double vision and pain.  Cardiovascular:  Negative for chest pain, claudication, dyspnea on exertion, leg swelling, near-syncope, orthopnea, palpitations, paroxysmal nocturnal dyspnea and syncope.  Respiratory:  Negative for hemoptysis and shortness of breath.   Musculoskeletal:  Negative for muscle cramps and myalgias.  Gastrointestinal:  Negative for abdominal pain, constipation, diarrhea, hematemesis, hematochezia, melena, nausea and vomiting.  Neurological:  Negative for dizziness and light-headedness.    PHYSICAL EXAM:    12/14/2022    1:03 PM 10/04/2022    9:36 AM 08/30/2022   11:45 AM  Vitals with BMI  Height 5\' 9"  5\' 9"  5\' 9"   Weight 167 lbs 168 lbs 168 lbs 10 oz  BMI 24.65 123XX123 [id number]  Systolic 123XX123 123XX123 98  Diastolic 74 70 68  Pulse 88 71 100    CONSTITUTIONAL: Well-developed and well-nourished. No acute distress.  SKIN: Skin is warm and dry. No rash noted. No cyanosis. No pallor. No jaundice HEAD: Normocephalic and atraumatic.  EYES: No scleral icterus MOUTH/THROAT: Moist oral membranes.  NECK: No JVD present. No thyromegaly noted. No carotid bruits  LYMPHATIC: No visible cervical adenopathy.  CHEST Normal respiratory effort. No intercostal retractions  LUNGS: Clear to auscultation bilaterally no stridor. No wheezes. No rales.  CARDIOVASCULAR: Regular rate and rhythm, positive S1-S2, no murmurs rubs or gallops appreciated. ABDOMINAL: No apparent ascites.  EXTREMITIES: No peripheral edema  HEMATOLOGIC: No significant bruising NEUROLOGIC: Oriented to person, place, and time. Nonfocal. Normal muscle tone.  PSYCHIATRIC: Normal mood and affect. Normal behavior. Cooperative  CARDIAC DATABASE: EKG: 03/19/2021: Normal sinus rhythm, 83 bpm, normal axis,  without underlying ischemia or injury pattern.  12/14/2022: Sinus Rhythm, normal R wave progression. No ischemia.  Echocardiogram: 03/30/2021: Left ventricle cavity is normal in size and wall thickness. Normal global wall motion. Normal LV systolic function with EF 62%. Normal diastolic filling pattern. No significant valvular abnormality. No evidence of pulmonary hypertension.  Stress Testing: No results found for this or any previous visit from the past 1095 days.  CCTA 03/31/2021: 1. Total coronary calcium score of 0. 2. Normal coronary origin with right dominance. 3. CAD-RADS = 0. 4. No acute or significant extracardiac abnormality.  Heart Catheterization: None   LABORATORY DATA:    Latest Ref Rng & Units 03/22/2021    8:43 PM 09/11/2013    1:02 PM  CBC  WBC 4.0 - 10.5 K/uL 5.1  4.3   Hemoglobin 12.0 - 15.0 g/dL 13.0  12.8   Hematocrit 36.0 - 46.0 % 40.7  40.0   Platelets 150 - 400 K/uL 183  170        Latest Ref Rng & Units 03/22/2021    8:43  PM 09/11/2013    1:02 PM  CMP  Glucose 70 - 99 mg/dL 585  91   BUN 6 - 20 mg/dL 24  16   Creatinine 2.77 - 1.00 mg/dL 8.24  2.35   Sodium 361 - 145 mmol/L 137  137   Potassium 3.5 - 5.1 mmol/L 3.6  3.8   Chloride 98 - 111 mmol/L 102  99   CO2 22 - 32 mmol/L 27  29   Calcium 8.9 - 10.3 mg/dL 44.3  15.4   Total Protein 6.0 - 8.3 g/dL  8.1   Total Bilirubin 0.3 - 1.2 mg/dL  0.5   Alkaline Phos 39 - 117 U/L  71   AST 0 - 37 U/L  31   ALT 0 - 35 U/L  37     Lipid Panel  No results found for: "CHOL", "TRIG", "HDL", "CHOLHDL", "VLDL", "LDLCALC", "LDLDIRECT", "LABVLDL"  No components found for: "NTPROBNP" No results for input(s): "PROBNP" in the last 8760 hours. No results for input(s): "TSH" in the last 8760 hours.  BMP No results for input(s): "NA", "K", "CL", "CO2", "GLUCOSE", "BUN", "CREATININE", "CALCIUM", "GFRNONAA", "GFRAA" in the last 8760 hours.   HEMOGLOBIN A1C No results found for: "HGBA1C", "MPG"   External  labs: Collected 03/11/2021. Total cholesterol 152, HDL 64, triglycerides 52, LDL 75, non-HDL 88. Creatinine 0.85 eGFR 87 Sodium 138, potassium 3.8, chloride 103, bicarb 27 AST 18, ALT 17, alkaline phosphatase 38 Hemoglobin 11.6, hematocrit 36.9 Hemoglobin A1c 5.9  IMPRESSION:    ICD-10-CM   1. Precordial pain  R07.2 EKG 12-Lead    2. Mixed hyperlipidemia  E78.2     3. Benign hypertension  I10     4. Non-insulin dependent type 2 diabetes mellitus (HCC)  E11.9        RECOMMENDATIONS: Amber Barber is a 62 y.o. female whose past medical history and cardiac risk factors include: Benign essential hypertension, hyperlipidemia, diabetes mellitus type 2 non-insulin-dependent, postmenopausal female.  Precordial pain: Resolved. Suspect costochondritis No use of sublingual nitroglycerin tablets since last office visit.   No additional diagnostic testing needed at this time.  Hypertension:  Well controlled  Medications reconciled.  Non-insulin-dependent diabetes mellitus type 2:  Educated on importance of glycemic control. Currently managed by primary care provider.  Mixed hyperlipidemia: Continue statin therapy.   FINAL MEDICATION LIST END OF ENCOUNTER: No orders of the defined types were placed in this encounter.   Medications Discontinued During This Encounter  Medication Reason   rosuvastatin (CRESTOR) 10 MG tablet Dose change   valsartan-hydrochlorothiazide (DIOVAN-HCT) 80-12.5 MG tablet Change in therapy     Current Outpatient Medications:    Cholecalciferol (D3) 50 MCG (2000 UT) TABS, Take 1 tablet by mouth daily., Disp: , Rfl:    cyclobenzaprine (FLEXERIL) 10 MG tablet, Take 10 mg by mouth 3 (three) times daily., Disp: , Rfl:    EPINEPHrine 0.3 mg/0.3 mL IJ SOAJ injection, Inject 0.3 mg into the muscle as needed for anaphylaxis., Disp: 2 each, Rfl: 1   famotidine (PEPCID) 40 MG tablet, Take 1 tablet (40 mg total) by mouth at bedtime., Disp: 30 tablet, Rfl: 5    FARXIGA 10 MG TABS tablet, Take 10 mg by mouth daily., Disp: , Rfl:    metoprolol tartrate (LOPRESSOR) 25 MG tablet, Take 1 tablet (25 mg total) by mouth 2 (two) times daily. (Patient taking differently: Take 25 mg by mouth daily.), Disp: 180 tablet, Rfl: 0   nitroGLYCERIN (NITROSTAT) 0.4 MG SL tablet, [DISSOLVE 1  TABLET UNDER THE TOUNGE EVERY 5 MINUTES AS NEEDED FOR CHEST PAIN (GO TO THE ER IF YOU REQUIRE MORE THAN 2 TABLETS 5 MINUTES APART)], Disp: , Rfl:    omeprazole (PRILOSEC) 40 MG capsule, Take 1 capsule (40 mg total) by mouth 2 (two) times daily. (Patient taking differently: Take 40 mg by mouth as needed.), Disp: 60 capsule, Rfl: 11   ONETOUCH VERIO test strip, , Disp: , Rfl:    rosuvastatin (CRESTOR) 20 MG tablet, Take 20 mg by mouth at bedtime., Disp: , Rfl:    Semaglutide,0.25 or 0.5MG /DOS, (OZEMPIC, 0.25 OR 0.5 MG/DOSE,) 2 MG/1.5ML SOPN, INJECT 0.25 MG SUBCUTANEOUSLY WEEKLY, Disp: , Rfl:    valsartan (DIOVAN) 80 MG tablet, Take 80 mg by mouth daily., Disp: , Rfl:    AgaMatrix Ultra-Thin Lancets MISC, OneTouch Delica Plus Lancet 33 gauge, Disp: , Rfl:    Blood Glucose Monitoring Suppl (GLUCOCOM BLOOD GLUCOSE MONITOR) DEVI, OneTouch Verio Flex Meter, Disp: , Rfl:    Blood Glucose Monitoring Suppl (ONETOUCH VERIO FLEX SYSTEM) w/Device KIT, OneTouch Verio Flex Meter, Disp: , Rfl:    Carbinoxamine Maleate 4 MG TABS, Take 2 tablets (8 mg total) by mouth 2 (two) times daily. (Patient not taking: Reported on 10/04/2022), Disp: 120 tablet, Rfl: 5   glucose blood (ONETOUCH VERIO) test strip, OneTouch Verio test strips, Disp: , Rfl:    glucose blood test strip, OneTouch Verio test strips, Disp: , Rfl:    Lancets (ONETOUCH DELICA PLUS [id number]) MISC, OneTouch Delica Plus Lancet 33 gauge, Disp: , Rfl:    Lancets (ONETOUCH DELICA PLUS [id number]) MISC, Apply topically., Disp: , Rfl:   Orders Placed This Encounter  Procedures   EKG 12-Lead      Floydene Flock, DO, [facility]  Pager:  [phone] Office: [phone]

## 2023-01-11 ENCOUNTER — Ambulatory Visit
Admission: RE | Admit: 2023-01-11 | Discharge: 2023-01-11 | Disposition: A | Discharge status: Home / Self Care | Payer: BC Managed Care – PPO | Source: Ambulatory Visit | Attending: Family Medicine | Admitting: Family Medicine

## 2023-01-11 DIAGNOSIS — Z1231 Encounter for screening mammogram for malignant neoplasm of breast: Secondary | ICD-10-CM

## 2023-06-02 DIAGNOSIS — D259 Leiomyoma of uterus, unspecified: Secondary | ICD-10-CM | POA: Insufficient documentation

## 2023-06-04 ENCOUNTER — Ambulatory Visit
Admission: EM | Admit: 2023-06-04 | Discharge: 2023-06-04 | Disposition: A | Discharge status: Home / Self Care | Payer: BC Managed Care – PPO | Attending: Emergency Medicine | Admitting: Emergency Medicine

## 2023-06-04 DIAGNOSIS — U071 COVID-19: Secondary | ICD-10-CM | POA: Diagnosis not present

## 2023-06-04 DIAGNOSIS — B349 Viral infection, unspecified: Secondary | ICD-10-CM | POA: Diagnosis not present

## 2023-06-04 MED ORDER — ACETAMINOPHEN 325 MG PO TABS
650.0000 mg | ORAL_TABLET | Freq: Once | ORAL | Status: AC
  Administered 2023-06-04: 650 mg via ORAL

## 2023-06-04 NOTE — ED Provider Notes (Signed)
[facility]    CSN: [id number] Arrival date & time: 06/04/23  0844      History   Chief Complaint Chief Complaint  Patient presents with   covid test    HPI Amber Barber is a 62 y.o. female.   Patient presents for evaluation of fever, chills, nasal congestion, rhinorrhea and intermittent generalized headaches present for 2 days.  Symptoms worsen in severity 1 day ago prompting home COVID test which was positive.  Fever peaking at 97.  Cough is nonproductive.  Denies presence of shortness of breath and wheezing.  No known sick contacts prior but did not attend a concert recently.  Managing symptoms with DayQuil and NyQuil which have been helpful.  Denies respiratory history, non-smoker.    Past Medical History:  Diagnosis Date   Diabetes mellitus without complication (HCC)    Hyperlipidemia    Hypertension    LSIL (low grade squamous intraepithelial lesion) on Pap smear 2010    Patient Active Problem List   Diagnosis Date Noted   Throat discomfort 04/28/2021   Chest fullness 04/28/2021   Chronic rhinitis 04/28/2021   Anaphylaxis due to hymenoptera venom 04/28/2021   Precordial pain     Past Surgical History:  Procedure Laterality Date   BREAST BIOPSY Bilateral 2010,2012   benign   BREAST SURGERY     core biopsy- L breast    COLONOSCOPY     long time   COLPOSCOPY  2010   ECTOPIC PREGNANCY SURGERY     x2   HAMMER TOE SURGERY     left little toe   SHOULDER ARTHROSCOPY Right 09/19/2013   Procedure: RIGHT ARTHROSCOPY SHOULDER WITH ACROMIOPLASTY ;  Surgeon: Kennieth Rad, MD;  Location: [facility];  Service: Orthopedics;  Laterality: Right;   TUBAL LIGATION      OB History     Gravida  3   Para      Term      Preterm      AB      Living  1      SAB      IAB      Ectopic      Multiple      Live Births               Home Medications    Prior to Admission medications   Medication Sig Start Date End Date Taking? Authorizing  Provider  AgaMatrix Ultra-Thin Lancets MISC OneTouch Delica Plus Lancet 33 gauge    [provider]  Blood Glucose Monitoring Suppl (GLUCOCOM BLOOD GLUCOSE MONITOR) DEVI OneTouch Verio Flex Meter    [provider]  Blood Glucose Monitoring Suppl (ONETOUCH VERIO FLEX SYSTEM) w/Device KIT OneTouch Verio Flex Meter    [provider]  Carbinoxamine Maleate 4 MG TABS Take 2 tablets (8 mg total) by mouth 2 (two) times daily. Patient not taking: Reported on 10/04/2022 08/30/22   Jessica Priest, MD  Cholecalciferol (D3) 50 MCG (2000 UT) TABS Take 1 tablet by mouth daily.    [provider]  cyclobenzaprine (FLEXERIL) 10 MG tablet Take 10 mg by mouth 3 (three) times daily. 08/04/22   [provider]  EPINEPHrine 0.3 mg/0.3 mL IJ SOAJ injection Inject 0.3 mg into the muscle as needed for anaphylaxis. 10/04/22   Kozlow, Alvira Philips, MD  famotidine (PEPCID) 40 MG tablet Take 1 tablet (40 mg total) by mouth at bedtime. 08/30/22   Kozlow, Alvira Philips, MD  FARXIGA 10 MG  TABS tablet Take 10 mg by mouth daily. 08/28/22   [provider]  glucose blood (ONETOUCH VERIO) test strip OneTouch Verio test strips    [provider]  glucose blood test strip OneTouch Verio test strips    [provider]  Lancets (ONETOUCH DELICA PLUS Collier Bullock) MISC OneTouch Delica Plus Lancet 33 gauge    [provider]  Lancets (ONETOUCH DELICA PLUS [id number]) MISC Apply topically. 07/26/22   [provider]  metoprolol tartrate (LOPRESSOR) 25 MG tablet Take 1 tablet (25 mg total) by mouth 2 (two) times daily. Patient taking differently: Take 25 mg by mouth daily. 03/19/21 12/14/22  Tolia, Sunit, DO  nitroGLYCERIN (NITROSTAT) 0.4 MG SL tablet [DISSOLVE 1 TABLET UNDER THE TOUNGE EVERY 5 MINUTES AS NEEDED FOR CHEST PAIN (GO TO THE ER IF YOU REQUIRE MORE THAN 2 TABLETS 5 MINUTES APART)]    [provider]  omeprazole (PRILOSEC) 40 MG capsule Take 1 capsule  (40 mg total) by mouth 2 (two) times daily. Patient taking differently: Take 40 mg by mouth as needed. 10/04/22   Kozlow, Alvira Philips, MD  ONETOUCH VERIO test strip  08/13/22   [provider]  rosuvastatin (CRESTOR) 20 MG tablet Take 20 mg by mouth at bedtime. 11/26/22   [provider]  Semaglutide,0.25 or 0.5MG /DOS, (OZEMPIC, 0.25 OR 0.5 MG/DOSE,) 2 MG/1.5ML SOPN INJECT 0.25 MG SUBCUTANEOUSLY WEEKLY 01/14/22   [provider]  valsartan (DIOVAN) 80 MG tablet Take 80 mg by mouth daily. 10/08/22   [provider]    Family History Family History  Problem Relation Age of Onset   Hypertension Mother    Hypertension Father    Diabetes Father    Heart disease Sister        congestive heart failure   Thyroid disease Sister    Hypertension Maternal Grandmother    Diabetes Maternal Grandmother    Breast cancer Maternal Aunt     Social History Social History   Tobacco Use   Smoking status: Never   Smokeless tobacco: Never  Vaping Use   Vaping status: Never Used  Substance Use Topics   Alcohol use: Yes    Comment: social- "every now & ten"    Drug use: No     Allergies   Bee venom, Bee pollen, Penicillins, and Pollen extract   Review of Systems Review of Systems  Constitutional:  Positive for chills and fever. Negative for activity change, appetite change, diaphoresis, fatigue and unexpected weight change.  HENT:  Positive for congestion and rhinorrhea. Negative for dental problem, drooling, ear discharge, ear pain, facial swelling, hearing loss, mouth sores, nosebleeds, postnasal drip, sinus pressure, sinus pain, sneezing, sore throat, tinnitus, trouble swallowing and voice change.   Respiratory:  Positive for cough. Negative for apnea, choking, chest tightness, shortness of breath, wheezing and stridor.   Cardiovascular: Negative.   Gastrointestinal: Negative.   Skin: Negative.   Neurological:  Positive for headaches. Negative for dizziness,  tremors, seizures, syncope, facial asymmetry, speech difficulty, weakness, light-headedness and numbness.     Physical Exam Triage Vital Signs ED Triage Vitals  Encounter Vitals Group     BP 06/04/23 0924 106/75     Systolic BP Percentile --      Diastolic BP Percentile --      Pulse Rate 06/04/23 0924 (!) 113     Resp 06/04/23 0924 16     Temp 06/04/23 0924 (!) 102.4 F (39.1 C)     Temp Source 06/04/23 1610  Oral     SpO2 06/04/23 0924 98 %     Weight --      Height --      Head Circumference --      Peak Flow --      Pain Score 06/04/23 0928 5     Pain Loc --      Pain Education --      Exclude from Growth Chart --    No data found.  Updated Vital Signs BP 106/75 (BP Location: Left Arm)   Pulse (!) 113   Temp (!) 102.4 F (39.1 C) (Oral)   Resp 16   LMP 12/03/2011   SpO2 98%   Visual Acuity Right Eye Distance:   Left Eye Distance:   Bilateral Distance:    Right Eye Near:   Left Eye Near:    Bilateral Near:     Physical Exam Constitutional:      Appearance: Normal appearance.  HENT:     Head: Normocephalic.     Right Ear: There is impacted cerumen.     Left Ear: There is impacted cerumen.     Nose: Congestion and rhinorrhea present.     Mouth/Throat:     Mouth: Mucous membranes are moist.     Pharynx: No posterior oropharyngeal erythema.  Cardiovascular:     Rate and Rhythm: Normal rate and regular rhythm.     Pulses: Normal pulses.     Heart sounds: Normal heart sounds.  Pulmonary:     Effort: Pulmonary effort is normal.     Breath sounds: Normal breath sounds.  Skin:    General: Skin is warm and dry.  Neurological:     Mental Status: She is alert and oriented to person, place, and time. Mental status is at baseline.      UC Treatments / Results  Labs (all labs ordered are listed, but only abnormal results are displayed) Labs Reviewed - No data to display  EKG   Radiology No results found.  Procedures Procedures (including critical  care time)  Medications Ordered in UC Medications  acetaminophen (TYLENOL) tablet 650 mg (650 mg Oral Given 06/04/23 0931)    Initial Impression / Assessment and Plan / UC Course  I have reviewed the triage vital signs and the nursing notes.  Pertinent labs & imaging results that were available during my care of the patient were reviewed by me and considered in my medical decision making (see chart for details).  Viral illness, COVID-19  Fever of 102.4 with associated tachycardia noted in triage, Tylenol given, discussed use of antivirals due to history of diabetes, declined, may continue use of over-the-counter medications and discussed quarantine guidelines per CDC, may follow-up with his urgent care for any concerns regarding breathing, Final Clinical Impressions(s) / UC Diagnoses   Final diagnoses:  Viral illness  COVID-19   Discharge Instructions   None    ED Prescriptions   None    PDMP not reviewed this encounter.   Valinda Hoar, NP 06/04/23 1004

## 2023-06-04 NOTE — ED Triage Notes (Signed)
Pt report chills, headacge, nasal congestion, cough, fever x 2 days. Dayquil and Nyquiil give some relief.   Pt had positive COVID test on 06/06/23.

## 2023-06-04 NOTE — Discharge Instructions (Signed)
Covid 19 is a virus and should steadily improve in time  Due to your history of diabetes you do qualify for antiviral medicine which helps to minimize symptoms and ideally keep illness from progressing to something more serious however does not fully take away illness, you have until Tuesday to decide if you would like to initiate medicine, please reach out to PCP if you change your mind  Current CDC guidelines require quarantine until 24 hours without fever then you may return to activity wearing mask until symptoms resolved, as a courtesy please notify family and friends as well as any large gatherings prior to attendance    You can take Tylenol and/or Ibuprofen as needed for fever reduction and pain relief.   For cough: honey 1/2 to 1 teaspoon (you can dilute the honey in water or another fluid).  You can also use guaifenesin and dextromethorphan for cough. You can use a humidifier for chest congestion and cough.  If you don't have a humidifier, you can sit in the bathroom with the hot shower running.      For sore throat: try warm salt water gargles, cepacol lozenges, throat spray, warm tea or water with lemon/honey, popsicles or ice, or OTC cold relief medicine for throat discomfort.   For congestion: take a daily anti-histamine like Zyrtec, Claritin, and a oral decongestant, such as pseudoephedrine.  You can also use Flonase 1-2 sprays in each nostril daily.   It is important to stay hydrated: drink plenty of fluids (water, gatorade/powerade/pedialyte, juices, or teas) to keep your throat moisturized and help further relieve irritation/discomfort.

## 2023-11-21 ENCOUNTER — Other Ambulatory Visit: Payer: Self-pay | Admitting: Family Medicine

## 2023-11-21 DIAGNOSIS — Z0000 Encounter for general adult medical examination without abnormal findings: Secondary | ICD-10-CM

## 2023-12-14 ENCOUNTER — Other Ambulatory Visit: Payer: Self-pay | Admitting: Gastroenterology

## 2023-12-14 DIAGNOSIS — R1033 Periumbilical pain: Secondary | ICD-10-CM

## 2023-12-19 ENCOUNTER — Other Ambulatory Visit: Payer: Self-pay

## 2023-12-19 ENCOUNTER — Ambulatory Visit (INDEPENDENT_AMBULATORY_CARE_PROVIDER_SITE_OTHER): Payer: Self-pay | Admitting: Allergy and Immunology

## 2023-12-19 ENCOUNTER — Ambulatory Visit
Admission: RE | Admit: 2023-12-19 | Discharge: 2023-12-19 | Disposition: A | Discharge status: Home / Self Care | Payer: BC Managed Care – PPO | Source: Ambulatory Visit | Attending: Gastroenterology | Admitting: Gastroenterology

## 2023-12-19 VITALS — BP 120/78 | HR 84 | Temp 97.8°F | Resp 18 | Ht 69.0 in | Wt 174.1 lb

## 2023-12-19 DIAGNOSIS — J3089 Other allergic rhinitis: Secondary | ICD-10-CM

## 2023-12-19 DIAGNOSIS — N951 Menopausal and female climacteric states: Secondary | ICD-10-CM | POA: Insufficient documentation

## 2023-12-19 DIAGNOSIS — T63481D Toxic effect of venom of other arthropod, accidental (unintentional), subsequent encounter: Secondary | ICD-10-CM

## 2023-12-19 DIAGNOSIS — K219 Gastro-esophageal reflux disease without esophagitis: Secondary | ICD-10-CM

## 2023-12-19 DIAGNOSIS — R1033 Periumbilical pain: Secondary | ICD-10-CM

## 2023-12-19 DIAGNOSIS — T63481A Toxic effect of venom of other arthropod, accidental (unintentional), initial encounter: Secondary | ICD-10-CM

## 2023-12-19 MED ORDER — OMEPRAZOLE 40 MG PO CPDR: 40.0000 mg | DELAYED_RELEASE_CAPSULE | Freq: Every day | ORAL | 3 refills | Status: AC

## 2023-12-19 MED ORDER — FLUTICASONE PROPIONATE 50 MCG/ACT NA SUSP: NASAL | 5 refills | Status: DC

## 2023-12-19 MED ORDER — FAMOTIDINE 40 MG PO TABS: 40.0000 mg | ORAL_TABLET | Freq: Every evening | ORAL | 3 refills | Status: AC

## 2023-12-19 MED ORDER — IOPAMIDOL (ISOVUE-300) INJECTION 61%
500.0000 mL | Freq: Once | INTRAVENOUS | Status: AC | PRN
  Administered 2023-12-19: 100 mL via INTRAVENOUS

## 2023-12-19 MED ORDER — EPINEPHRINE 0.3 MG/0.3ML IJ SOAJ: 0.3000 mg | INTRAMUSCULAR | 2 refills | Status: AC | PRN

## 2023-12-19 NOTE — Progress Notes (Unsigned)
[place] [facility] [place] [facility] [place] - [place]   Follow-up Note  Referring Provider: Leilani Able, MD Primary Provider: Leilani Able, MD Date of Office Visit: 12/19/2023  Subjective:   Amber Barber (DOB: February 28, 1961) is a 63 y.o. female who [facility] in re-evaluation of the following:  HPI: Cimberly returns to this clinic in evaluation of LPR and a history of hymenoptera venom hypersensitivity.  I last saw her in this clinic 04 October 2022.  Her throat issue is a collection of some form of vocal cord dysfunction addressed at [facility] and possible reflux induced respiratory disease.  To determine if reflux was contributing to some of these issues she wanted to taper off her medications for reflux during her last visit.  She is actually done pretty well regarding her throat.  And her classic reflux is very intermittent and she only uses her omeprazole and famotidine if needed which is less than once a month.  She has been having a lot of nasal stuffiness and some head fullness and she would like to restart some nasal steroids.  She continues to carry an EpiPen for her hymenoptera venom hypersensitivity state.  She has received this years flu vaccine.  Allergies as of 12/19/2023       Reactions   Bee Venom Anaphylaxis   Bee Pollen Cough   Seasonal allergies    Other Rash   Penicillins Rash   Swelling (not throat swelling), swelling in other parts of body   Pollen Extract Cough   Seasonal allergies         Medication List    Carbinoxamine Maleate 4 MG Tabs Take 2 tablets (8 mg total) by mouth 2 (two) times daily.   cyclobenzaprine 10 MG tablet Commonly known as: FLEXERIL Take 10 mg by mouth 3 (three) times daily.   D3 50 MCG (2000 UT) Tabs Generic drug: Cholecalciferol Take 1 tablet by mouth daily.   EPINEPHrine 0.3 mg/0.3 mL Soaj injection Commonly known as: EPI-PEN Inject 0.3 mg into the muscle as needed for  anaphylaxis.   famotidine 40 MG tablet Commonly known as: PEPCID Take 1 tablet (40 mg total) by mouth at bedtime.   Farxiga 10 MG Tabs tablet Generic drug: dapagliflozin propanediol Take 10 mg by mouth daily.   glucose blood test strip OneTouch Verio test strips   OneTouch Verio test strip Generic drug: glucose blood OneTouch Verio test strips   OneTouch Verio test strip Generic drug: glucose blood   metoprolol tartrate 25 MG tablet Commonly known as: LOPRESSOR Take 1 tablet (25 mg total) by mouth 2 (two) times daily.   nitroGLYCERIN 0.4 MG SL tablet Commonly known as: NITROSTAT [DISSOLVE 1 TABLET UNDER THE TOUNGE EVERY 5 MINUTES AS NEEDED FOR CHEST PAIN (GO TO THE ER IF YOU REQUIRE MORE THAN 2 TABLETS 5 MINUTES APART)]   omeprazole 40 MG capsule Commonly known as: PRILOSEC Take 1 capsule (40 mg total) by mouth 2 (two) times daily.   OneTouch Delica Plus Lancet33G Misc OneTouch Delica Plus Lancet 33 gauge   AgaMatrix Ultra-Thin Lancets Misc OneTouch Delica Plus Lancet 33 gauge   OneTouch Delica Plus Lancet33G Misc Apply topically.   OneTouch Verio Flex System w/Device Kit Engineer, drilling Blood Glucose Monitor Devi OneTouch Verio Flex Meter   Ozempic (0.25 or 0.5 MG/DOSE) 2 MG/1.5ML Sopn Generic drug: Semaglutide(0.25 or 0.5MG /DOS) INJECT 0.25 MG SUBCUTANEOUSLY WEEKLY   rosuvastatin 20 MG tablet Commonly  known as: CRESTOR Take 20 mg by mouth at bedtime.   valsartan 80 MG tablet Commonly known as: DIOVAN Take 80 mg by mouth daily.    Past Medical History:  Diagnosis Date   Diabetes mellitus without complication (HCC)    Hyperlipidemia    Hypertension    LSIL (low grade squamous intraepithelial lesion) on Pap smear 2010    Past Surgical History:  Procedure Laterality Date   BREAST BIOPSY Bilateral 2010,2012   benign   BREAST SURGERY     core biopsy- L breast    COLONOSCOPY     long time   COLPOSCOPY  2010   ECTOPIC  PREGNANCY SURGERY     x2   HAMMER TOE SURGERY     left little toe   SHOULDER ARTHROSCOPY Right 09/19/2013   Procedure: RIGHT ARTHROSCOPY SHOULDER WITH ACROMIOPLASTY ;  Surgeon: Kennieth Rad, MD;  Location: [facility] OR;  Service: Orthopedics;  Laterality: Right;   TUBAL LIGATION      Review of systems negative except as noted in HPI / PMHx or noted below:  Review of Systems  Constitutional: Negative.   HENT: Negative.    Eyes: Negative.   Respiratory: Negative.    Cardiovascular: Negative.   Gastrointestinal: Negative.   Genitourinary: Negative.   Musculoskeletal: Negative.   Skin: Negative.   Neurological: Negative.   Endo/Heme/Allergies: Negative.   Psychiatric/Behavioral: Negative.       Objective:   Vitals:   12/19/23 1546  BP: 120/78  Pulse: 84  Resp: 18  Temp: 97.8 F (36.6 C)  SpO2: 98%   Height: 5\' 9"  (175.3 cm)  Weight: 174 lb 1.6 oz (79 kg)   Physical Exam Constitutional:      Appearance: She is not diaphoretic.  HENT:     Head: Normocephalic.     Right Ear: Tympanic membrane, ear canal and external ear normal.     Left Ear: Tympanic membrane, ear canal and external ear normal.     Nose: Nose normal. No mucosal edema or rhinorrhea.     Mouth/Throat:     Pharynx: Uvula midline. No oropharyngeal exudate.  Eyes:     Conjunctiva/sclera: Conjunctivae normal.  Neck:     Thyroid: No thyromegaly.     Trachea: Trachea normal. No tracheal tenderness or tracheal deviation.  Cardiovascular:     Rate and Rhythm: Normal rate and regular rhythm.     Heart sounds: Normal heart sounds, S1 normal and S2 normal. No murmur heard. Pulmonary:     Effort: No respiratory distress.     Breath sounds: Normal breath sounds. No stridor. No wheezing or rales.  Lymphadenopathy:     Head:     Right side of head: No tonsillar adenopathy.     Left side of head: No tonsillar adenopathy.     Cervical: No cervical adenopathy.  Skin:    Findings: No erythema or rash.     Nails:  There is no clubbing.  Neurological:     Mental Status: She is alert.     Diagnostics: none  Assessment and Plan:   1. Systemic reaction to hymenoptera sting   2. LPRD (laryngopharyngeal reflux disease)   3. Perennial allergic rhinitis    1.  If needed:   A.  EpiPen, Benadryl, MD/ER evaluation  B.  Omeprazole 40 mg - 1 tablet 1 time per day  C.  Famotidine 40 mg - 1 tablet 1 time per day  D.  Flonase 2 sprays each nostril 3-7 times  a week  2.  Influenza = Tamiflu.  COVID = Paxlovid.  3.  Return to clinic in 1 year or earlier if problem  Madia appears to be doing quite well and she has a selection of medications that she can use should they be required as noted above to address her issues and we will see her back in this clinic in 1 year or earlier if there is a problem.  Laurette Schimke, MD Allergy / Immunology [facility]

## 2023-12-19 NOTE — Patient Instructions (Addendum)
  1.  If needed:   A.  EpiPen, Benadryl, MD/ER evaluation  B.  Omeprazole 40 mg - 1 tablet 1 time per day  C.  Famotidine 40 mg - 1 tablet 1 time per day  D.  Flonase 2 sprays each nostril 3-7 times a week  2.  Influenza = Tamiflu.  COVID = Paxlovid.  3.  Return to clinic in 1 year or earlier if problem

## 2023-12-20 ENCOUNTER — Other Ambulatory Visit (HOSPITAL_COMMUNITY): Payer: Self-pay

## 2023-12-20 ENCOUNTER — Encounter: Payer: Self-pay | Admitting: Allergy and Immunology

## 2023-12-20 ENCOUNTER — Telehealth: Payer: Self-pay

## 2023-12-20 MED ORDER — FLUTICASONE PROPIONATE 50 MCG/ACT NA SUSP: 2.0000 | NASAL | 3 refills | Status: DC

## 2023-12-20 NOTE — Telephone Encounter (Signed)
*  Asthma/Allergy  Pharmacy Patient Advocate Encounter   Received notification from CoverMyMeds that prior authorization for Omeprazole 40MG  dr capsules  is required/requested.   Insurance verification completed.   The patient is insured through [facility] .   Per test claim: PA required; PA submitted to above mentioned insurance via CoverMyMeds Key/confirmation #/EOC [id number] Status is pending

## 2023-12-20 NOTE — Addendum Note (Signed)
Addended by: Robet Leu A on: 12/20/2023 10:26 AM   Modules accepted: Orders

## 2023-12-20 NOTE — Telephone Encounter (Signed)
*  Asthma/Allergy  Pharmacy Patient Advocate Encounter   Received notification from CoverMyMeds that prior authorization for Famotidine 40MG  tablets  is required/requested.   Insurance verification completed.   The patient is insured through [facility] .   Per test claim: PA required; PA submitted to above mentioned insurance via CoverMyMeds Key/confirmation #/EOC [facility] Status is pending

## 2023-12-20 NOTE — Telephone Encounter (Signed)
Called and notified patient of the PA denial. Patient expressed that she will order the medication on line.

## 2023-12-20 NOTE — Telephone Encounter (Signed)
Pharmacy Patient Advocate Encounter  Received notification from [facility] that Prior Authorization for Omeprazole 40mg  has been DENIED.  Full denial letter will be uploaded to the media tab. See denial reason below.   PA #/Case ID/Reference #: Denied. This health benefit plan does not cover the following services, supplies, drugs or charges: Any drug that is therapeutically equivalent to an over-the-counter drug where the over-the-counter products contain the same active ingredients as the prescription product at the same, or similar, strengths. -OR- Drugs that are Purchased over-the-counter, unless specifically listed as a covered drug in the formulary and a written prescription is provided

## 2023-12-20 NOTE — Telephone Encounter (Signed)
Pharmacy Patient Advocate Encounter  Received notification from [facility] that Prior Authorization for Famotidine 40mg  has been DENIED.  Full denial letter will be uploaded to the media tab. See denial reason below.   PA #/Case ID/Reference #: Denied. This health benefit plan does not cover the following services, supplies, drugs or charges: Any drug that is therapeutically equivalent to an over-the-counter drug where the over-the-counter products contain the same active ingredients as the prescription product at the same, or similar, strengths. -OR- Drugs that are Purchased over-the-counter, unless specifically listed as a covered drug in the formulary and a written prescription is provided

## 2024-01-15 ENCOUNTER — Ambulatory Visit
Admission: RE | Admit: 2024-01-15 | Discharge: 2024-01-15 | Disposition: A | Discharge status: Home / Self Care | Payer: BC Managed Care – PPO | Source: Ambulatory Visit | Attending: Family Medicine | Admitting: Family Medicine

## 2024-01-15 DIAGNOSIS — Z0000 Encounter for general adult medical examination without abnormal findings: Secondary | ICD-10-CM

## 2024-04-02 ENCOUNTER — Ambulatory Visit
Admission: EM | Admit: 2024-04-02 | Discharge: 2024-04-02 | Disposition: A | Discharge status: Home / Self Care | Attending: Family Medicine | Admitting: Family Medicine

## 2024-04-02 ENCOUNTER — Encounter: Payer: Self-pay | Admitting: Emergency Medicine

## 2024-04-02 ENCOUNTER — Other Ambulatory Visit: Payer: Self-pay

## 2024-04-02 DIAGNOSIS — L259 Unspecified contact dermatitis, unspecified cause: Secondary | ICD-10-CM | POA: Diagnosis not present

## 2024-04-02 MED ORDER — DEXAMETHASONE SODIUM PHOSPHATE 10 MG/ML IJ SOLN
10.0000 mg | Freq: Once | INTRAMUSCULAR | Status: AC
  Administered 2024-04-02: 10 mg via INTRAVENOUS

## 2024-04-02 MED ORDER — PREDNISONE 20 MG PO TABS: 40.0000 mg | ORAL_TABLET | Freq: Every day | ORAL | 0 refills | Status: AC

## 2024-04-02 NOTE — Discharge Instructions (Addendum)
 You were seen today for contact dermatitis/poison ivy.  I have given you a shot of a steroid today for this, and sent out an oral course of prednisone.  Please start this tomorrow.  I recommend you may take claritin/zyrtec for swelling and itching.  You may use over the counter cortisone cream for the rash on her face/arm if needed.  Please wash your clothing and bedding in hot/warm water separately as well.

## 2024-04-02 NOTE — ED Triage Notes (Signed)
 Pt here for swelling to face from poison ivy; pt sts started 3 days ago after working in yard

## 2024-04-02 NOTE — ED Provider Notes (Signed)
 [facility]    CSN: [id number] Arrival date & time: 04/02/24  1003      History   Chief Complaint Chief Complaint  Patient presents with   Rash    HPI Amber Barber is a 63 y.o. female.    Rash  Patient is here for a rash to her face.  She was trimming the bushes over the weekend, and that evening started with a rash to her jaw.  The next morning more of her face was involved, and even more today.  Swelling and rash to her forehead,around her eyes and cheeks.  She has a small patch of rash to her arm.  No difficulty with swallowing or breathing.        Past Medical History:  Diagnosis Date   Diabetes mellitus without complication (HCC)    Hyperlipidemia    Hypertension    LSIL (low grade squamous intraepithelial lesion) on Pap smear 2010    Patient Active Problem List   Diagnosis Date Noted   Menopausal symptom 12/19/2023   Uterine leiomyoma 06/02/2023   Essential hypertension 12/21/2021   Postmenopausal bleeding 12/21/2021   Throat discomfort 04/28/2021   Chest fullness 04/28/2021   Chronic rhinitis 04/28/2021   Anaphylaxis due to hymenoptera venom 04/28/2021   Precordial pain    Diabetes mellitus (HCC) 12/20/2019   Prediabetes 11/29/2016    Past Surgical History:  Procedure Laterality Date   BREAST BIOPSY Bilateral 2010,2012   benign   BREAST SURGERY     core biopsy- L breast    COLONOSCOPY     long time   COLPOSCOPY  2010   ECTOPIC PREGNANCY SURGERY     x2   HAMMER TOE SURGERY     left little toe   SHOULDER ARTHROSCOPY Right 09/19/2013   Procedure: RIGHT ARTHROSCOPY SHOULDER WITH ACROMIOPLASTY ;  Surgeon: Jonna Netter, MD;  Location: [facility] OR;  Service: Orthopedics;  Laterality: Right;   TUBAL LIGATION      OB History     Gravida  3   Para      Term      Preterm      AB      Living  1      SAB      IAB      Ectopic      Multiple      Live Births               Home Medications    Prior to  Admission medications   Medication Sig Start Date End Date Taking? Authorizing Provider  AgaMatrix Ultra-Thin Lancets MISC OneTouch Delica Plus Lancet 33 gauge    [provider]  Blood Glucose Monitoring Suppl (GLUCOCOM BLOOD GLUCOSE MONITOR) DEVI OneTouch Verio Flex Meter    [provider]  Blood Glucose Monitoring Suppl (ONETOUCH VERIO FLEX SYSTEM) w/Device KIT OneTouch Verio Flex Meter    [provider]  Carbinoxamine  Maleate 4 MG TABS Take 2 tablets (8 mg total) by mouth 2 (two) times daily. 08/30/22   Kozlow, Rema Care, MD  Cholecalciferol (D3) 50 MCG (2000 UT) TABS Take 1 tablet by mouth daily.    [provider]  cyclobenzaprine  (FLEXERIL ) 10 MG tablet Take 10 mg by mouth 3 (three) times daily. 08/04/22   [provider]  EPINEPHrine  0.3 mg/0.3 mL IJ SOAJ injection Inject 0.3 mg into the muscle as needed for anaphylaxis. 12/19/23   Kozlow, Rema Care, MD  famotidine  (PEPCID ) 40 MG tablet  Take 1 tablet (40 mg total) by mouth at bedtime. 12/19/23   Kozlow, Rema Care, MD  FARXIGA 10 MG TABS tablet Take 10 mg by mouth daily. 08/28/22   [provider]  fluticasone  (FLONASE ) 50 MCG/ACT nasal spray Place 2 sprays into both nostrils every other day. 2 sprays each nostril 3-7 times a week 12/20/23   Kozlow, Rema Care, MD  glucose blood (ONETOUCH VERIO) test strip OneTouch Verio test strips    [provider]  glucose blood test strip OneTouch Verio test strips    [provider]  Lancets (ONETOUCH DELICA PLUS [place] Dao) MISC OneTouch Delica Plus Lancet 33 gauge    [provider]  Lancets (ONETOUCH DELICA PLUS LANCET33G) MISC Apply topically. 07/26/22   [provider]  metoprolol  tartrate (LOPRESSOR ) 25 MG tablet Take 1 tablet (25 mg total) by mouth 2 (two) times daily. Patient taking differently: Take 25 mg by mouth daily. 03/19/21 12/14/22  Tolia, Sunit, DO  nitroGLYCERIN  (NITROSTAT ) 0.4 MG SL tablet [DISSOLVE 1 TABLET UNDER  THE TOUNGE EVERY 5 MINUTES AS NEEDED FOR CHEST PAIN (GO TO THE ER IF YOU REQUIRE MORE THAN 2 TABLETS 5 MINUTES APART)]    [provider]  omeprazole  (PRILOSEC) 40 MG capsule Take 1 capsule (40 mg total) by mouth daily in the afternoon. 12/19/23   Kozlow, Rema Care, MD  ONETOUCH VERIO test strip  08/13/22   [provider]  rosuvastatin (CRESTOR) 20 MG tablet Take 20 mg by mouth at bedtime. 11/26/22   [provider]  Semaglutide,0.25 or 0.5MG /DOS, (OZEMPIC, 0.25 OR 0.5 MG/DOSE,) 2 MG/1.5ML SOPN INJECT 0.25 MG SUBCUTANEOUSLY WEEKLY 01/14/22   [provider]  valsartan (DIOVAN) 80 MG tablet Take 80 mg by mouth daily. 10/08/22   [provider]    Family History Family History  Problem Relation Age of Onset   Hypertension Mother    Hypertension Father    Diabetes Father    Breast cancer Sister 78   Heart disease Sister        congestive heart failure   Thyroid  disease Sister    Breast cancer Daughter 43   Breast cancer Maternal Aunt    Hypertension Maternal Grandmother    Diabetes Maternal Grandmother     Social History Social History   Tobacco Use   Smoking status: Never   Smokeless tobacco: Never  Vaping Use   Vaping status: Never Used  Substance Use Topics   Alcohol use: Yes    Comment: social- "every now & ten"    Drug use: No     Allergies   Bee venom, Bee pollen, Other, Penicillins, and Pollen extract   Review of Systems Review of Systems  Constitutional: Negative.   HENT: Negative.    Respiratory: Negative.    Cardiovascular: Negative.   Gastrointestinal: Negative.   Musculoskeletal: Negative.   Skin:  Positive for rash.     Physical Exam Triage Vital Signs ED Triage Vitals  Encounter Vitals Group     BP 04/02/24 1109 121/72     Systolic BP Percentile --      Diastolic BP Percentile --      Pulse Rate 04/02/24 1109 77     Resp 04/02/24 1109 18     Temp 04/02/24 1109 98.5 F (36.9 C)     Temp Source 04/02/24  1109 Oral     SpO2 04/02/24 1109 97 %     Weight --      Height --  Head Circumference --      Peak Flow --      Pain Score 04/02/24 1110 0     Pain Loc --      Pain Education --      Exclude from Growth Chart --    No data found.  Updated Vital Signs BP 121/72 (BP Location: Left Arm)   Pulse 77   Temp 98.5 F (36.9 C) (Oral)   Resp 18   LMP 12/03/2011   SpO2 97%   Visual Acuity Right Eye Distance:   Left Eye Distance:   Bilateral Distance:    Right Eye Near:   Left Eye Near:    Bilateral Near:     Physical Exam Constitutional:      Appearance: Normal appearance. She is normal weight.  Cardiovascular:     Rate and Rhythm: Normal rate and regular rhythm.  Pulmonary:     Effort: Pulmonary effort is normal.     Breath sounds: Normal breath sounds.  Skin:    Comments: Raised, dry rash to the forehead, around the eyes, cheeks and jaw line;  small area of rash to the left wrist;   Neurological:     General: No focal deficit present.     Mental Status: She is alert.  Psychiatric:        Mood and Affect: Mood normal.      UC Treatments / Results  Labs (all labs ordered are listed, but only abnormal results are displayed) Labs Reviewed - No data to display  EKG   Radiology No results found.  Procedures Procedures (including critical care time)  Medications Ordered in UC Medications  dexamethasone  (DECADRON ) injection 10 mg (has no administration in time range)    Initial Impression / Assessment and Plan / UC Course  I have reviewed the triage vital signs and the nursing notes.  Pertinent labs & imaging results that were available during my care of the patient were reviewed by me and considered in my medical decision making (see chart for details).    Final Clinical Impressions(s) / UC Diagnoses   Final diagnoses:  Contact dermatitis, unspecified contact dermatitis type, unspecified trigger     Discharge Instructions      You were seen  today for contact dermatitis/poison ivy.  I have given you a shot of a steroid today for this, and sent out an oral course of prednisone.  Please start this tomorrow.  I recommend you may take claritin/zyrtec for swelling and itching.  You may use over the counter cortisone cream for the rash on her face/arm if needed.  Please wash your clothing and bedding in hot/warm water separately as well.   ED Prescriptions     Medication Sig Dispense Auth. Provider   predniSONE (DELTASONE) 20 MG tablet Take 2 tablets (40 mg total) by mouth daily for 5 days. 10 tablet Lesle Ras, MD      PDMP not reviewed this encounter.   Lesle Ras, MD 04/02/24 1119

## 2024-07-30 ENCOUNTER — Other Ambulatory Visit: Payer: Self-pay | Admitting: Allergy

## 2024-07-30 ENCOUNTER — Other Ambulatory Visit: Payer: Self-pay

## 2024-07-30 MED ORDER — FLUTICASONE PROPIONATE 50 MCG/ACT NA SUSP: 2.0000 | NASAL | 3 refills | Status: DC

## 2024-11-18 ENCOUNTER — Other Ambulatory Visit: Payer: Self-pay | Admitting: Family Medicine

## 2024-11-18 DIAGNOSIS — Z1231 Encounter for screening mammogram for malignant neoplasm of breast: Secondary | ICD-10-CM

## 2025-01-15 ENCOUNTER — Ambulatory Visit
